# Patient Record
Sex: Male | Born: 1980 | Race: Black or African American | Hispanic: No | Marital: Single | Smoking: Current some day smoker
Health system: Southern US, Community
[De-identification: ages and names within clinical notes are randomized; demographics above are authoritative.]

## PROBLEM LIST (undated history)

## (undated) DIAGNOSIS — R45851 Suicidal ideations: Secondary | ICD-10-CM

## (undated) DIAGNOSIS — T1491XA Suicide attempt, initial encounter: Secondary | ICD-10-CM

## (undated) HISTORY — PX: APPENDECTOMY: SHX54

## (undated) HISTORY — PX: OTHER SURGICAL HISTORY: SHX169

## (undated) HISTORY — PX: HIP FRACTURE SURGERY: SHX118

---

## 2000-06-02 ENCOUNTER — Emergency Department (HOSPITAL_COMMUNITY): Admission: EM | Admit: 2000-06-02 | Discharge: 2000-06-02 | Payer: Self-pay | Admitting: *Deleted

## 2008-05-25 ENCOUNTER — Emergency Department (HOSPITAL_COMMUNITY): Admission: EM | Admit: 2008-05-25 | Discharge: 2008-05-25 | Payer: Self-pay | Admitting: Emergency Medicine

## 2010-05-26 NOTE — Consult Note (Signed)
[place]. [facility]  Patient:    Andrew Snow, Andrew Snow                       MRN: [id number] Adm. Date:  [id number] Attending:  Ephriam Knuckles H                          Consultation Report  REQUESTING PHYSICIAN:  Dyanne Carrel, M.D.  HISTORY OF PRESENT ILLNESS:  Andrew Snow is a 30 year old right-hand dominant [employer] part-time worker who struck the wall of his house on the morning of his evaluation.  He presents now with complaints of right hand pain.  PAST MEDICAL HISTORY:  Insignificant.  He has had a left finger fracture surgery before.  CURRENT MEDICATIONS:  None.  ALLERGIES:  No known drug allergies.  PHYSICAL EXAMINATION:  EXTREMITIES:  On physical examination of the right hand, he has no rotational deformity of any of the fingers.  He has swelling at the metacarpal neck fifth finger.  Sensation perfusion to the fifth finger is intact.  The patient has full composite flexion and extension of all fingers.  Plain x-rays demonstrate about a 20-degree displaced metacarpal neck fracture on the right fifth metacarpal.  IMPRESSION:  Minimally displaced right metacarpal neck fracture.  PLAN:  I placed the patient in an ulnar gutter splint with the metacarpophalangeals flexed.  I am going to see him back on Friday for repeat x-rays in the ulnar gutter splint.  I gave him a note for work and also pain medicine, Vicodin #30.  I will see him back on Friday. DD:  06/02/00 TD:  06/03/00 Job: [id number]

## 2010-06-06 ENCOUNTER — Emergency Department (HOSPITAL_COMMUNITY)
Admission: EM | Admit: 2010-06-06 | Discharge: 2010-06-06 | Disposition: A | Discharge status: Home / Self Care | Payer: Self-pay | Attending: Emergency Medicine | Admitting: Emergency Medicine

## 2010-06-06 DIAGNOSIS — Z96649 Presence of unspecified artificial hip joint: Secondary | ICD-10-CM | POA: Insufficient documentation

## 2010-06-06 DIAGNOSIS — G43909 Migraine, unspecified, not intractable, without status migrainosus: Secondary | ICD-10-CM | POA: Insufficient documentation

## 2011-06-11 ENCOUNTER — Emergency Department (HOSPITAL_COMMUNITY)
Admission: EM | Admit: 2011-06-11 | Discharge: 2011-06-11 | Disposition: A | Discharge status: Home / Self Care | Payer: Self-pay | Attending: Emergency Medicine | Admitting: Emergency Medicine

## 2011-06-11 ENCOUNTER — Encounter (HOSPITAL_COMMUNITY): Payer: Self-pay | Admitting: Emergency Medicine

## 2011-06-11 ENCOUNTER — Emergency Department (HOSPITAL_COMMUNITY): Payer: Self-pay

## 2011-06-11 DIAGNOSIS — X58XXXA Exposure to other specified factors, initial encounter: Secondary | ICD-10-CM | POA: Insufficient documentation

## 2011-06-11 DIAGNOSIS — S8391XA Sprain of unspecified site of right knee, initial encounter: Secondary | ICD-10-CM

## 2011-06-11 DIAGNOSIS — IMO0002 Reserved for concepts with insufficient information to code with codable children: Secondary | ICD-10-CM | POA: Insufficient documentation

## 2011-06-11 DIAGNOSIS — Y9367 Activity, basketball: Secondary | ICD-10-CM | POA: Insufficient documentation

## 2011-06-11 DIAGNOSIS — F17200 Nicotine dependence, unspecified, uncomplicated: Secondary | ICD-10-CM | POA: Insufficient documentation

## 2011-06-11 MED ORDER — NAPROXEN 500 MG PO TABS: 500.0000 mg | ORAL_TABLET | Freq: Two times a day (BID) | ORAL | Status: AC

## 2011-06-11 NOTE — ED Provider Notes (Signed)
History     CSN: [id number]  Arrival date & time 06/11/11  4098   First MD Initiated Contact with Patient 06/11/11 [phone]      Chief Complaint  Patient presents with  . Knee Pain    (Consider location/radiation/quality/duration/timing/severity/associated sxs/prior treatment) Patient is a 31 y.o. male presenting with knee pain. The history is provided by the patient.  Knee Pain This is a new problem. The current episode started in the past 7 days. The problem has been gradually worsening. Associated symptoms include arthralgias and joint swelling. Pertinent negatives include no chills, fever, numbness or weakness.  Pt states he was playing basketball. States stepped the wrong way and felt a "pop" and pain in the right knee. States was able to finish playing, however since then pain in the knee, liming. Pain worsened with movement and bearing weight. No other injuries. No prior injuries to that knee. Iced at home, taken ibuprofen with no relief. Injury occurred 5 days ago.  History reviewed. No pertinent past medical history.  History reviewed. No pertinent past surgical history.  No family history on file.  History  Substance Use Topics  . Smoking status: Current Everyday Smoker    Types: Cigarettes  . Smokeless tobacco: Not on file  . Alcohol Use: Yes     socially      Review of Systems  Constitutional: Negative for fever and chills.  Respiratory: Negative.   Cardiovascular: Negative.   Musculoskeletal: Positive for joint swelling and arthralgias.  Skin: Negative.   Neurological: Negative for weakness and numbness.    Allergies  Review of patient's allergies indicates no known allergies.  Home Medications   Current Outpatient Rx  Name Route Sig Dispense Refill  . IBUPROFEN 200 MG PO TABS Oral Take 200-800 mg by mouth every 6 (six) hours as needed. For pain      BP 123/73  Pulse 57  Temp(Src) 98.1 F (36.7 C) (Oral)  Resp 18  SpO2 98%  Physical Exam    Nursing note and vitals reviewed. Constitutional: He is oriented to person, place, and time. He appears well-developed and well-nourished.  Eyes: Conjunctivae are normal.  Neck: Neck supple.  Cardiovascular: Normal rate, regular rhythm and normal heart sounds.   Pulmonary/Chest: Effort normal and breath sounds normal. No respiratory distress. He has no wheezes. He has no rales.  Musculoskeletal:       Right knee swelling noted, mild. Tender to palpation over anterior and lateral right knee joint. Full Rom of the knee joint. Pain and some laxity with anterior drawer test. No laxity with posterior drawer test or with medial or lateral stress.   Neurological: He is alert and oriented to person, place, and time.  Skin: Skin is warm and dry.  Psychiatric: He has a normal mood and affect.    ED Course  Procedures (including critical care time)  No results found for this or any previous visit. Dg Knee Complete 4 Views Right  06/11/2011  *RADIOLOGY REPORT*  Clinical Data: Trauma on 05/30.  Limited range of motion with pain and tightness.  RIGHT KNEE - COMPLETE 4+ VIEW  Comparison: None.  Findings: No acute fracture or dislocation.  Clothing artifact over the distal femur. No joint effusion.  Joint spaces are maintained.  IMPRESSION: No acute osseous abnormality.  Original Report Authenticated By: Consuello Bossier, M.D.    Pt with possible ACL injury, some laxity with anterior drawer test, knee swelling. Pt is ambulatory, however painful.    1. Knee  sprain, right, initial encounter       MDM          Lottie Mussel, PA 06/11/11 1530

## 2011-06-11 NOTE — ED Notes (Signed)
Pt presenting to ed with c/o right knee pain x 4 days s/p injuring it playing basketball pt states he has noticed swelling and worsening pain. Pt able to ambulate in triage

## 2011-06-11 NOTE — Discharge Instructions (Signed)
Your x-ray is normal today. I do suspect you may have injured your ACL ligament. Keep your knee elevated at home. Ice it. Naprosyn for pain and inflammation. Avoid running, jumping, excessive walking. Follow up with orthopedics if not improving.    Knee Sprain You have a knee sprain. Sprains are painful injuries to the joints. A sprain is a partial or complete tearing of ligaments. Ligaments are tough, fibrous tissues that hold bones together at the joints. A strain (sprain) has occurred when a ligament is stretched or damaged. This injury may take several weeks to heal. This is often the same length of time as a bone fracture (break in bone) takes to heal. Even though a fracture (bone break) may not have occurred, the recovery times may be similar. HOME CARE INSTRUCTIONS   Rest the injured area for as long as directed by your caregiver. Then slowly start using the joint as directed by your caregiver and as the pain allows. Use crutches as directed. If the knee was splinted or casted, continue use and care as directed. If an ace bandage has been applied today, it should be removed and reapplied every 3 to 4 hours. It should not be applied tightly, but firmly enough to keep swelling down. Watch toes and feet for swelling, bluish discoloration, coldness, numbness or excessive pain. If any of these symptoms occur, remove the ace bandage and reapply more loosely.If these symptoms persist, seek medical attention.   For the first 24 hours, lie down. Keep the injured extremity elevated on two pillows.   Apply ice to the injured area for 15 to 20 minutes every couple hours. Repeat this 3 to 4 times per day for the first 48 hours. Put the ice in a plastic bag and place a towel between the bag of ice and your skin.   Wear any splinting, casting, or elastic bandage applications as instructed.   Only take over-the-counter or prescription medicines for pain, discomfort, or fever as directed by your caregiver. Do  not use aspirin immediately after the injury unless instructed by your caregiver. Aspirin can cause increased bleeding and bruising of the tissues.   If you were given crutches, continue to use them as instructed. Do not resume weight bearing on the affected extremity until instructed.  Persistent pain and inability to use the injured area as directed for more than 2 to 3 days are warning signs. If this happens you should see a caregiver for a follow-up visit as soon as possible. Initially, a hairline fracture (this is the same as a broken bone) may not be evident on x-rays. Persistent pain and swelling indicate that further evaluation, non-weight bearing (use of crutches as instructed), and/or further x-rays are indicated. X-rays may sometimes not show a small fracture until a week or ten days later. Make a follow-up appointment with your own caregiver or one to whom we have referred you. A radiologist (specialist in reading x-rays) may re-read your X-rays. Make sure you know how you are to get your x-ray results. Do not assume everything is normal if you do not hear from [place]. SEEK MEDICAL CARE IF:   Bruising, swelling, or pain increases.   You have cold or numb toes   You have continuing difficulty or pain with walking.  SEEK IMMEDIATE MEDICAL CARE IF:   Your toes are cold, numb or blue.   The pain is not responding to medications and continues to stay the same or get worse.  MAKE SURE YOU:  Understand these instructions.   Will watch your condition.   Will get help right away if you are not doing well or get worse.  Document Released: 12/25/2004 Document Revised: 12/14/2010 Document Reviewed: 12/09/2006 [facility] Patient Information 2012 [place], [place].

## 2011-06-14 NOTE — ED Provider Notes (Signed)
Medical screening examination/treatment/procedure(s) were performed by non-physician practitioner and as supervising physician I was immediately available for consultation/collaboration.   Suzi Roots, MD 06/14/11 [phone]

## 2012-02-06 ENCOUNTER — Emergency Department (HOSPITAL_COMMUNITY)
Admission: EM | Admit: 2012-02-06 | Discharge: 2012-02-06 | Disposition: A | Discharge status: Home / Self Care | Payer: Self-pay | Attending: Emergency Medicine | Admitting: Emergency Medicine

## 2012-02-06 ENCOUNTER — Encounter (HOSPITAL_COMMUNITY): Payer: Self-pay | Admitting: Emergency Medicine

## 2012-02-06 DIAGNOSIS — M25559 Pain in unspecified hip: Secondary | ICD-10-CM | POA: Insufficient documentation

## 2012-02-06 DIAGNOSIS — T7840XA Allergy, unspecified, initial encounter: Secondary | ICD-10-CM

## 2012-02-06 DIAGNOSIS — L299 Pruritus, unspecified: Secondary | ICD-10-CM | POA: Insufficient documentation

## 2012-02-06 DIAGNOSIS — F17200 Nicotine dependence, unspecified, uncomplicated: Secondary | ICD-10-CM | POA: Insufficient documentation

## 2012-02-06 DIAGNOSIS — R21 Rash and other nonspecific skin eruption: Secondary | ICD-10-CM | POA: Insufficient documentation

## 2012-02-06 DIAGNOSIS — T4995XA Adverse effect of unspecified topical agent, initial encounter: Secondary | ICD-10-CM | POA: Insufficient documentation

## 2012-02-06 DIAGNOSIS — Z79899 Other long term (current) drug therapy: Secondary | ICD-10-CM | POA: Insufficient documentation

## 2012-02-06 MED ORDER — PREDNISONE 20 MG PO TABS
60.0000 mg | ORAL_TABLET | Freq: Once | ORAL | Status: AC
  Administered 2012-02-06: 60 mg via ORAL
  Filled 2012-02-06: qty 3

## 2012-02-06 MED ORDER — HYDROCODONE-ACETAMINOPHEN 5-325 MG PO TABS: 2.0000 | ORAL_TABLET | ORAL | Status: DC | PRN

## 2012-02-06 MED ORDER — PREDNISONE 10 MG PO TABS: ORAL_TABLET | ORAL | Status: DC

## 2012-02-06 NOTE — ED Notes (Addendum)
Pt reports that l/eye was swollen shut this am. L/eye lid swollen , but open at present. Multiple red raised bumps on l/side of face and all extremities noted.Unresponsive to hydrocortisone cream this am. Moderately responsive to Benadryl 50 mg po. Denies difficulty swallowing or shortness of breath Reports recurrent r/hip pain-hx of trauma

## 2012-02-06 NOTE — ED Provider Notes (Signed)
History     CSN: [id number]  Arrival date & time 02/06/12  1227   First MD Initiated Contact with Patient 02/06/12 1256      Chief Complaint  Patient presents with  . Facial Swelling    Pt reports possible inscect bites last night. Red raised bumps on face and extremeties.  . Allergic Reaction  . 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 male presenting with allergic reaction and hip pain. The history is provided by the patient. No language interpreter was used.  Allergic Reaction The primary symptoms are  rash. The primary symptoms do not include wheezing, cough, nausea or vomiting. The current episode started 6 to 12 hours ago. The problem has not changed since onset. The rash is associated with itching.  Significant symptoms also include itching.  Hip Pain This is a recurrent problem. The current episode started in the past 7 days. The problem occurs constantly. The problem has been unchanged. Associated symptoms include a rash. Pertinent negatives include no coughing, joint swelling, nausea, numbness, vomiting or weakness. The symptoms are aggravated by bending.    History reviewed. No pertinent past medical history.  Past Surgical History  Procedure Date  . Appendectomy   . Hip fracture surgery     r/hip    History reviewed. No pertinent family history.  History  Substance Use Topics  . Smoking status: Current Every Day Smoker    Types: Cigarettes  . Smokeless tobacco: Not on file  .  Alcohol Use: Yes     Comment: socially      Review of Systems  Constitutional: Negative.   Respiratory: Negative for cough and wheezing.   Gastrointestinal: Negative for nausea and vomiting.  Musculoskeletal: Negative for joint swelling.  Skin: Positive for itching and rash.  Neurological: Negative for weakness and numbness.    Allergies  Penicillins  Home Medications   Current Outpatient Rx  Name  Route  Sig  Dispense  Refill  . DIPHENHYDRAMINE HCL 25 MG PO TABS   Oral   Take 50 mg by mouth every 6 (six) hours as needed.         Marland Kitchen HYDROCORTISONE 1 % EX CREA   Topical   Apply 1 application topically 2 (two) times daily.         . IBUPROFEN 200 MG PO TABS   Oral   Take 200-800 mg by mouth every 6 (six) hours as needed. For pain         . NAPROXEN 500 MG PO TABS   Oral   Take 1 tablet (500 mg total) by mouth 2 (two) times daily.   30 tablet   0     BP 116/70  Pulse 63  Temp 98.1 F (36.7 C) (Oral)  Resp 18  Physical Exam  Nursing note and vitals reviewed. Constitutional: He is oriented to person, place, and time. He appears well-developed and well-nourished.  HENT:  Right Ear: External ear normal.  Left Ear: External ear normal.  No oral swelling notec  Eyes: Conjunctivae normal and EOM are normal. Pupils are equal, round, and reactive to light.  Neck: Normal range of motion. Neck supple.  Cardiovascular: Normal rate and regular rhythm.   Pulmonary/Chest: Effort normal and breath sounds normal.  Musculoskeletal: Normal range of motion.       Pt tender in the right groin with palpation:pt has full rom  Neurological: He is alert and oriented to person, place, and time.  Skin:       Redness and swelling noted to the left AVW:UJWJ swelling noted to hand:pt has red raised areas to upper body    ED Course  Procedures (including critical care time)  Labs Reviewed - No data to display No results found.   1. Allergic reaction   2. Hip pain        MDM  Will treat for allergic reaction:pt is not having any oral involvement:will do steroid for a couple of days       Teressa Lower, NP 02/06/12 1313

## 2012-02-07 NOTE — ED Provider Notes (Signed)
Medical screening examination/treatment/procedure(s) were performed by non-physician practitioner and as supervising physician I was immediately available for consultation/collaboration.  Casimer Russett T Hania Cerone, MD 02/07/12 0904 

## 2013-05-06 ENCOUNTER — Encounter (HOSPITAL_BASED_OUTPATIENT_CLINIC_OR_DEPARTMENT_OTHER): Payer: Self-pay | Admitting: Emergency Medicine

## 2013-05-06 ENCOUNTER — Emergency Department (HOSPITAL_BASED_OUTPATIENT_CLINIC_OR_DEPARTMENT_OTHER)
Admission: EM | Admit: 2013-05-06 | Discharge: 2013-05-06 | Disposition: A | Discharge status: Home / Self Care | Payer: Self-pay | Attending: Emergency Medicine | Admitting: Emergency Medicine

## 2013-05-06 DIAGNOSIS — S61219A Laceration without foreign body of unspecified finger without damage to nail, initial encounter: Secondary | ICD-10-CM

## 2013-05-06 DIAGNOSIS — Z880 Allergy status to penicillin: Secondary | ICD-10-CM | POA: Insufficient documentation

## 2013-05-06 DIAGNOSIS — F17200 Nicotine dependence, unspecified, uncomplicated: Secondary | ICD-10-CM | POA: Insufficient documentation

## 2013-05-06 DIAGNOSIS — Z23 Encounter for immunization: Secondary | ICD-10-CM | POA: Insufficient documentation

## 2013-05-06 DIAGNOSIS — IMO0002 Reserved for concepts with insufficient information to code with codable children: Secondary | ICD-10-CM | POA: Insufficient documentation

## 2013-05-06 DIAGNOSIS — S61209A Unspecified open wound of unspecified finger without damage to nail, initial encounter: Secondary | ICD-10-CM | POA: Insufficient documentation

## 2013-05-06 DIAGNOSIS — Y9389 Activity, other specified: Secondary | ICD-10-CM | POA: Insufficient documentation

## 2013-05-06 DIAGNOSIS — Y929 Unspecified place or not applicable: Secondary | ICD-10-CM | POA: Insufficient documentation

## 2013-05-06 DIAGNOSIS — W260XXA Contact with knife, initial encounter: Secondary | ICD-10-CM | POA: Insufficient documentation

## 2013-05-06 DIAGNOSIS — W261XXA Contact with sword or dagger, initial encounter: Secondary | ICD-10-CM

## 2013-05-06 MED ORDER — TETANUS-DIPHTH-ACELL PERTUSSIS 5-2.5-18.5 LF-MCG/0.5 IM SUSP
0.5000 mL | Freq: Once | INTRAMUSCULAR | Status: AC
  Administered 2013-05-06: 0.5 mL via INTRAMUSCULAR
  Filled 2013-05-06: qty 0.5

## 2013-05-06 NOTE — ED Provider Notes (Signed)
CSN: [id number]     Arrival date & time 05/06/13  [id number] History   First MD Initiated Contact with Patient 05/06/13 1032     Chief Complaint  Patient presents with  . Extremity Laceration     (Consider location/radiation/quality/duration/timing/severity/associated sxs/prior Treatment) HPI  Patient presents with left third digit laceration. He states that he cut with a knife yesterday at approximately 2 PM. He has noted intermittent bleeding since that time. He is unsure when his last tetanus was. He denies any other injury.  History reviewed. No pertinent past medical history. Past Surgical History  Procedure Laterality Date  . Appendectomy    . Hip fracture surgery      r/hip   No family history on file. History  Substance Use Topics  . Smoking status: Current Every Day Smoker -- 0.50 packs/day    Types: Cigarettes  . Smokeless tobacco: Not on file  . Alcohol Use: Yes     Comment: socially    Review of Systems  Skin: Positive for wound.  Neurological: Negative for numbness.  All other systems reviewed and are negative.     Allergies  Penicillins  Home Medications   Prior to Admission medications   Medication Sig Start Date End Date Taking? Authorizing Provider  diphenhydrAMINE (BENADRYL) 25 MG tablet Take 50 mg by mouth every 6 (six) hours as needed.    Historical Provider, MD  HYDROcodone-acetaminophen (NORCO/VICODIN) 5-325 MG per tablet Take 2 tablets by mouth every 4 (four) hours as needed for pain. 02/06/12   Teressa LowerVrinda Pickering, NP  hydrocortisone cream 1 % Apply 1 application topically 2 (two) times daily.    Historical Provider, MD  ibuprofen (ADVIL,MOTRIN) 200 MG tablet Take 200-800 mg by mouth every 6 (six) hours as needed. For pain    Historical Provider, MD  predniSONE (DELTASONE) 10 MG tablet Six day step down 02/06/12   Teressa LowerVrinda Pickering, NP   BP 131/87  Pulse 81  Temp(Src) 98.1 F (36.7 C) (Oral)  Resp 16  Ht 6' (1.829 m)  Wt 260 lb (117.935 kg)  BMI  35.25 kg/m2  SpO2 100% Physical Exam  Nursing note and vitals reviewed. Constitutional: He is oriented to person, place, and time. He appears well-developed and well-nourished. No distress.  HENT:  Head: Normocephalic and atraumatic.  Cardiovascular: Normal rate and regular rhythm.   No murmur heard. Pulmonary/Chest: Effort normal. No respiratory distress.  Musculoskeletal: He exhibits no edema.  Lymphadenopathy:    He has no cervical adenopathy.  Neurological: He is alert and oriented to person, place, and time.  Skin: Skin is warm and dry.  One has to minor laceration over the left third digit and over the lateral aspect of the pad of the finger and just adjacent to the nailbed. No active bleeding noted, no evidence of erythema, good sensation distal  Psychiatric: He has a normal mood and affect.    ED Course  Procedures (including critical care time) Labs Review Labs Reviewed - No data to display  Imaging Review No results found.   EKG Interpretation None      MDM   Final diagnoses:  Laceration of finger    Patient presents with laceration to the left third digit. Happened approximately 18 hours ago. No signs of infection noted. No active bleeding. Discussed with patient risk for infection with primary closure. Will allow to close by secondary intention. Patient's tetanus was updated. Wound was cleaned and dressed.  After history, exam, and medical workup I feel the patient  has been appropriately medically screened and is safe for discharge home. Pertinent diagnoses were discussed with the patient. Patient was given return precautions.     Shon Batonourtney F Amery Minasyan, MD 05/06/13 1116

## 2013-05-06 NOTE — Discharge Instructions (Signed)
Laceration Care, Adult A laceration is a cut or lesion that goes through all layers of the skin and into the tissue just beneath the skin. TREATMENT  Some lacerations may not require closure. Some lacerations may not be able to be closed due to an increased risk of infection. It is important to see your caregiver as soon as possible after an injury to minimize the risk of infection and maximize the opportunity for successful closure.  HOME CARE INSTRUCTIONS  Keep wound clean dry and covered. You may need a tetanus shot if:  You cannot remember when you had your last tetanus shot.  You have never had a tetanus shot. If you get a tetanus shot, your arm may swell, get red, and feel warm to the touch. This is common and not a problem. If you need a tetanus shot and you choose not to have one, there is a rare chance of getting tetanus. Sickness from tetanus can be serious. SEEK MEDICAL CARE IF:   You have redness, swelling, or increasing pain in the wound.  You see a red line that goes away from the wound.  You have yellowish-white fluid (pus) coming from the wound.  You have a fever.  You notice a bad smell coming from the wound or dressing.  Your wound breaks open before or after sutures have been removed.  You notice something coming out of the wound such as wood or glass.  Your wound is on your hand or foot and you cannot move a finger or toe. SEEK IMMEDIATE MEDICAL CARE IF:   Your pain is not controlled with prescribed medicine.  You have severe swelling around the wound causing pain and numbness or a change in color in your arm, hand, leg, or foot.  Your wound splits open and starts bleeding.  You have worsening numbness, weakness, or loss of function of any joint around or beyond the wound.  You develop painful lumps near the wound or on the skin anywhere on your body. MAKE SURE YOU:   Understand these instructions.  Will watch your condition.  Will get help right  away if you are not doing well or get worse. Document Released: 12/25/2004 Document Revised: 03/19/2011 Document Reviewed: 06/20/2010 [facility] Patient Information 2014 [place], [place].

## 2013-05-06 NOTE — ED Notes (Signed)
Laceration to 3rd digit on right hand sustained by a knife yesterday.

## 2013-07-20 ENCOUNTER — Emergency Department (HOSPITAL_BASED_OUTPATIENT_CLINIC_OR_DEPARTMENT_OTHER)
Admission: EM | Admit: 2013-07-20 | Discharge: 2013-07-20 | Disposition: A | Discharge status: Home / Self Care | Payer: Self-pay | Attending: Emergency Medicine | Admitting: Emergency Medicine

## 2013-07-20 ENCOUNTER — Emergency Department (HOSPITAL_BASED_OUTPATIENT_CLINIC_OR_DEPARTMENT_OTHER): Payer: Self-pay

## 2013-07-20 ENCOUNTER — Encounter (HOSPITAL_BASED_OUTPATIENT_CLINIC_OR_DEPARTMENT_OTHER): Payer: Self-pay | Admitting: Emergency Medicine

## 2013-07-20 DIAGNOSIS — Y939 Activity, unspecified: Secondary | ICD-10-CM | POA: Insufficient documentation

## 2013-07-20 DIAGNOSIS — W2209XA Striking against other stationary object, initial encounter: Secondary | ICD-10-CM | POA: Insufficient documentation

## 2013-07-20 DIAGNOSIS — Y929 Unspecified place or not applicable: Secondary | ICD-10-CM | POA: Insufficient documentation

## 2013-07-20 DIAGNOSIS — S62309A Unspecified fracture of unspecified metacarpal bone, initial encounter for closed fracture: Secondary | ICD-10-CM | POA: Insufficient documentation

## 2013-07-20 DIAGNOSIS — F17200 Nicotine dependence, unspecified, uncomplicated: Secondary | ICD-10-CM | POA: Insufficient documentation

## 2013-07-20 DIAGNOSIS — Z880 Allergy status to penicillin: Secondary | ICD-10-CM | POA: Insufficient documentation

## 2013-07-20 DIAGNOSIS — S62339A Displaced fracture of neck of unspecified metacarpal bone, initial encounter for closed fracture: Secondary | ICD-10-CM

## 2013-07-20 DIAGNOSIS — K644 Residual hemorrhoidal skin tags: Secondary | ICD-10-CM

## 2013-07-20 MED ORDER — HYDROCODONE-ACETAMINOPHEN 5-325 MG PO TABS: 1.0000 | ORAL_TABLET | ORAL | Status: DC | PRN

## 2013-07-20 MED ORDER — HYDROCORTISONE 2.5 % RE CREA: TOPICAL_CREAM | RECTAL | Status: DC

## 2013-07-20 MED ORDER — HYDROCODONE-ACETAMINOPHEN 5-325 MG PO TABS
1.0000 | ORAL_TABLET | Freq: Once | ORAL | Status: AC
  Administered 2013-07-20: 1 via ORAL
  Filled 2013-07-20: qty 1

## 2013-07-20 NOTE — ED Notes (Signed)
C/o left hand injury last pm he states he hit had against wall.  Swelling noted to left hand with abraisions to knuckles noted. NL sensation and pulses.

## 2013-07-20 NOTE — ED Provider Notes (Signed)
CSN: [id number]     Arrival date & time 07/20/13  1110 History   First MD Initiated Contact with Patient 07/20/13 1118     Chief Complaint  Patient presents with  . Hand Problem     (Consider location/radiation/quality/duration/timing/severity/associated sxs/prior Treatment) Patient is a 33 y.o. male presenting with hand injury and hematochezia. The history is provided by the patient. No language interpreter was used.  Hand Injury Location:  Hand Injury: yes   Hand location:  L hand Pain details:    Quality:  Aching   Radiates to:  Does not radiate   Severity:  Moderate Chronicity:  New Dislocation: no   Foreign body present:  No foreign bodies Tetanus status:  Up to date Associated symptoms: no fever   Associated symptoms comment:  He hit a wall with his left hand last night and presents for evaluation of pain and swelling.  Rectal Bleeding Quality:  Bright red Associated symptoms: no abdominal pain and no fever   Associated symptoms comment:  He complains of pain and swelling around the rectum with infrequent bleeding. He reports a history of hemorrhoids.   History reviewed. No pertinent past medical history. Past Surgical History  Procedure Laterality Date  . Appendectomy    . Hip fracture surgery      r/hip   No family history on file. History  Substance Use Topics  . Smoking status: Current Every Day Smoker -- 0.50 packs/day    Types: Cigarettes  . Smokeless tobacco: Not on file  . Alcohol Use: Yes     Comment: socially    Review of Systems  Constitutional: Negative for fever.  Gastrointestinal: Positive for hematochezia and rectal pain. Negative for abdominal pain.  Musculoskeletal:       See HPI.      Allergies  Penicillins  Home Medications   Prior to Admission medications   Medication Sig Start Date End Date Taking? Authorizing Provider  diphenhydrAMINE (BENADRYL) 25 MG tablet Take 50 mg by mouth every 6 (six) hours as needed.    Historical  Provider, MD  HYDROcodone-acetaminophen (NORCO/VICODIN) 5-325 MG per tablet Take 2 tablets by mouth every 4 (four) hours as needed for pain. 02/06/12   Teressa LowerVrinda Pickering, NP  hydrocortisone cream 1 % Apply 1 application topically 2 (two) times daily.    Historical Provider, MD  ibuprofen (ADVIL,MOTRIN) 200 MG tablet Take 200-800 mg by mouth every 6 (six) hours as needed. For pain    Historical Provider, MD  predniSONE (DELTASONE) 10 MG tablet Six day step down 02/06/12   Teressa LowerVrinda Pickering, NP   BP 128/83  Pulse 88  Temp(Src) 98.3 F (36.8 C) (Oral)  Resp 18  SpO2 96% Physical Exam  Constitutional: He is oriented to person, place, and time. He appears well-developed and well-nourished. No distress.  Eyes: Conjunctivae are normal.  Genitourinary:  Multiple swollen nodular areas on anus. No active bleeding. Minimally tender. No discoloration.  Musculoskeletal:  Left hand marked dorsal swelling. No bony deformity or discoloration. There are abrasions to PIP dorsum of 3rd and 4th fingers without swelling or loss of range of motion.   Neurological: He is alert and oriented to person, place, and time.  No sensory deficits of left hand.  Skin: Skin is warm and dry.  Psychiatric: He has a normal mood and affect.    ED Course  Procedures (including critical care time) Labs Review Labs Reviewed - No data to display  Imaging Review No results found.   EKG Interpretation  None      MDM   Final diagnoses:  None  1. Boxer's fracture, left 2. External hemorrhoids  Ulnar gutter splint applied. He is referred to hand for follow up. Anusol given for hemorrhoidal swelling.     Arnoldo Hooker, PA-C 07/20/13 1258

## 2013-07-20 NOTE — Discharge Instructions (Signed)
Boxer's Fracture °You have a break (fracture) of the fifth metacarpal bone. This is commonly called a boxer's fracture. This is the bone in the hand where the little finger attaches. The fracture is in the end of that bone, closest to the little finger. It is usually caused when you hit an object with a clenched fist. Often, the knuckle is pushed down by the impact. Sometimes, the fracture rotates out of position. A boxer's fracture will usually heal within 6 weeks, if it is treated properly and protected from re-injury. Surgery is sometimes needed. °A cast, splint, or bulky hand dressing may be used to protect and immobilize a boxer's fracture. Do not remove this device or dressing until your caregiver approves. Keep your hand elevated, and apply ice packs for 15-20 minutes every 2 hours, for the first 2 days. Elevation and ice help reduce swelling and relieve pain. See your caregiver, or an orthopedic specialist, for follow-up care within the next 10 days. This is to make sure your fracture is healing properly. °Document Released: 12/25/2004 Document Revised: 03/19/2011 Document Reviewed: 06/14/2006 °ExitCare® Patient Information ©2015 [employer]. This information is not intended to replace advice given to you by your health care provider. Make sure you discuss any questions you have with your health care provider. ° °Cast or Splint Care °Casts and splints support injured limbs and keep bones from moving while they heal. It is important to care for your cast or splint at home.   °HOME CARE INSTRUCTIONS °· Keep the cast or splint uncovered during the drying period. It can take 24 to 48 hours to dry if it is made of plaster. A fiberglass cast will dry in less than 1 hour. °· Do not rest the cast on anything harder than a pillow for the first 24 hours. °· Do not put weight on your injured limb or apply pressure to the cast until your health care provider gives you permission. °· Keep the cast or splint dry. Wet  casts or splints can lose their shape and may not support the limb as well. A wet cast that has lost its shape can also create harmful pressure on your skin when it dries. Also, wet skin can become infected. °¨ Cover the cast or splint with a plastic bag when bathing or when out in the rain or snow. If the cast is on the trunk of the body, take sponge baths until the cast is removed. °¨ If your cast does become wet, dry it with a towel or a blow dryer on the cool setting only. °· Keep your cast or splint clean. Soiled casts may be wiped with a moistened cloth. °· Do not place any hard or soft foreign objects under your cast or splint, such as cotton, toilet paper, lotion, or powder. °· Do not try to scratch the skin under the cast with any object. The object could get stuck inside the cast. Also, scratching could lead to an infection. If itching is a problem, use a blow dryer on a cool setting to relieve discomfort. °· Do not trim or cut your cast or remove padding from inside of it. °· Exercise all joints next to the injury that are not immobilized by the cast or splint. For example, if you have a long leg cast, exercise the hip joint and toes. If you have an arm cast or splint, exercise the shoulder, elbow, thumb, and fingers. °· Elevate your injured arm or leg on 1 or 2 pillows for the   first 1 to 3 days to decrease swelling and pain.It is best if you can comfortably elevate your cast so it is higher than your heart. SEEK MEDICAL CARE IF:   Your cast or splint cracks.  Your cast or splint is too tight or too loose.  You have unbearable itching inside the cast.  Your cast becomes wet or develops a soft spot or area.  You have a bad smell coming from inside your cast.  You get an object stuck under your cast.  Your skin around the cast becomes red or raw.  You have new pain or worsening pain after the cast has been applied. SEEK IMMEDIATE MEDICAL CARE IF:   You have fluid leaking through the  cast.  You are unable to move your fingers or toes.  You have discolored (blue or white), cool, painful, or very swollen fingers or toes beyond the cast.  You have tingling or numbness around the injured area.  You have severe pain or pressure under the cast.  You have any difficulty with your breathing or have shortness of breath.  You have chest pain. Document Released: 12/23/1999 Document Revised: 10/15/2012 Document Reviewed: 07/03/2012 [facility] Patient Information 2015 [place], [place]. This information is not intended to replace advice given to you by your health care provider. Make sure you discuss any questions you have with your health care provider. Cryotherapy Cryotherapy means treatment with cold. Ice or gel packs can be used to reduce both pain and swelling. Ice is the most helpful within the first 24 to 48 hours after an injury or flareup from overusing a muscle or joint. Sprains, strains, spasms, burning pain, shooting pain, and aches can all be eased with ice. Ice can also be used when recovering from surgery. Ice is effective, has very few side effects, and is safe for most people to use. PRECAUTIONS  Ice is not a safe treatment option for people with:  Raynaud's phenomenon. This is a condition affecting small blood vessels in the extremities. Exposure to cold may cause your problems to return.  Cold hypersensitivity. There are many forms of cold hypersensitivity, including:  Cold urticaria. Red, itchy hives appear on the skin when the tissues begin to warm after being iced.  Cold erythema. This is a red, itchy rash caused by exposure to cold.  Cold hemoglobinuria. Red blood cells break down when the tissues begin to warm after being iced. The hemoglobin that carry oxygen are passed into the urine because they cannot combine with blood proteins fast enough.  Numbness or altered sensitivity in the area being iced. If you have any of the following conditions, do not use  ice until you have discussed cryotherapy with your caregiver:  Heart conditions, such as arrhythmia, angina, or chronic heart disease.  High blood pressure.  Healing wounds or open skin in the area being iced.  Current infections.  Rheumatoid arthritis.  Poor circulation.  Diabetes. Ice slows the blood flow in the region it is applied. This is beneficial when trying to stop inflamed tissues from spreading irritating chemicals to surrounding tissues. However, if you expose your skin to cold temperatures for too long or without the proper protection, you can damage your skin or nerves. Watch for signs of skin damage due to cold. HOME CARE INSTRUCTIONS Follow these tips to use ice and cold packs safely.  Place a dry or damp towel between the ice and skin. A damp towel will cool the skin more quickly, so you may need  to shorten the time that the ice is used.  For a more rapid response, add gentle compression to the ice.  Ice for no more than 10 to 20 minutes at a time. The bonier the area you are icing, the less time it will take to get the benefits of ice.  Check your skin after 5 minutes to make sure there are no signs of a poor response to cold or skin damage.  Rest 20 minutes or more in between uses.  Once your skin is numb, you can end your treatment. You can test numbness by very lightly touching your skin. The touch should be so light that you do not see the skin dimple from the pressure of your fingertip. When using ice, most people will feel these normal sensations in this order: cold, burning, aching, and numbness.  Do not use ice on someone who cannot communicate their responses to pain, such as small children or people with dementia. HOW TO MAKE AN ICE PACK Ice packs are the most common way to use ice therapy. Other methods include ice massage, ice baths, and cryo-sprays. Muscle creams that cause a cold, tingly feeling do not offer the same benefits that ice offers and should  not be used as a substitute unless recommended by your caregiver. To make an ice pack, do one of the following:  Place crushed ice or a bag of frozen vegetables in a sealable plastic bag. Squeeze out the excess air. Place this bag inside another plastic bag. Slide the bag into a pillowcase or place a damp towel between your skin and the bag.  Mix 3 parts water with 1 part rubbing alcohol. Freeze the mixture in a sealable plastic bag. When you remove the mixture from the freezer, it will be slushy. Squeeze out the excess air. Place this bag inside another plastic bag. Slide the bag into a pillowcase or place a damp towel between your skin and the bag. SEEK MEDICAL CARE IF:  You develop white spots on your skin. This may give the skin a blotchy (mottled) appearance.  Your skin turns blue or pale.  Your skin becomes waxy or hard.  Your swelling gets worse. MAKE SURE YOU:   Understand these instructions.  Will watch your condition.  Will get help right away if you are not doing well or get worse. Document Released: 08/21/2010 Document Revised: 03/19/2011 Document Reviewed: 08/21/2010 [facility] Patient Information 2015 [place], [place]. This information is not intended to replace advice given to you by your health care provider. Make sure you discuss any questions you have with your health care provider.

## 2013-07-21 NOTE — ED Provider Notes (Signed)
Medical screening examination/treatment/procedure(s) were performed by non-physician practitioner and as supervising physician I was immediately available for consultation/collaboration.   Francheska Villeda T Duaa Stelzner, MD 07/21/13 1012 

## 2013-07-26 ENCOUNTER — Emergency Department (HOSPITAL_BASED_OUTPATIENT_CLINIC_OR_DEPARTMENT_OTHER): Payer: Self-pay

## 2013-07-26 ENCOUNTER — Emergency Department (HOSPITAL_BASED_OUTPATIENT_CLINIC_OR_DEPARTMENT_OTHER)
Admission: EM | Admit: 2013-07-26 | Discharge: 2013-07-26 | Disposition: A | Discharge status: Home / Self Care | Payer: Self-pay | Attending: Emergency Medicine | Admitting: Emergency Medicine

## 2013-07-26 ENCOUNTER — Encounter (HOSPITAL_BASED_OUTPATIENT_CLINIC_OR_DEPARTMENT_OTHER): Payer: Self-pay | Admitting: Emergency Medicine

## 2013-07-26 DIAGNOSIS — Z79899 Other long term (current) drug therapy: Secondary | ICD-10-CM | POA: Insufficient documentation

## 2013-07-26 DIAGNOSIS — Y9389 Activity, other specified: Secondary | ICD-10-CM | POA: Insufficient documentation

## 2013-07-26 DIAGNOSIS — IMO0002 Reserved for concepts with insufficient information to code with codable children: Secondary | ICD-10-CM | POA: Insufficient documentation

## 2013-07-26 DIAGNOSIS — S62309D Unspecified fracture of unspecified metacarpal bone, subsequent encounter for fracture with routine healing: Secondary | ICD-10-CM

## 2013-07-26 DIAGNOSIS — F17200 Nicotine dependence, unspecified, uncomplicated: Secondary | ICD-10-CM | POA: Insufficient documentation

## 2013-07-26 DIAGNOSIS — S62339A Displaced fracture of neck of unspecified metacarpal bone, initial encounter for closed fracture: Secondary | ICD-10-CM | POA: Insufficient documentation

## 2013-07-26 DIAGNOSIS — Z8781 Personal history of (healed) traumatic fracture: Secondary | ICD-10-CM | POA: Insufficient documentation

## 2013-07-26 DIAGNOSIS — Y9289 Other specified places as the place of occurrence of the external cause: Secondary | ICD-10-CM | POA: Insufficient documentation

## 2013-07-26 DIAGNOSIS — Z880 Allergy status to penicillin: Secondary | ICD-10-CM | POA: Insufficient documentation

## 2013-07-26 MED ORDER — HYDROCODONE-ACETAMINOPHEN 5-325 MG PO TABS
1.0000 | ORAL_TABLET | Freq: Once | ORAL | Status: AC
  Administered 2013-07-26: 1 via ORAL
  Filled 2013-07-26: qty 1

## 2013-07-26 NOTE — ED Notes (Signed)
Pt states he injured lt arm and has cast on it. Pt reports he hit it at work today and wants it checked out .

## 2013-07-26 NOTE — Discharge Instructions (Signed)
Do not hesitate to return to the Emergency Department for any new, worsening or concerning symptoms.   If you do not have a primary care doctor you can establish one at the   [facility]: [address] [place] [id number] [phone]  After you establish care. Let them know you were seen in the emergency room. They must obtain records for further management.    Cast or Splint Care Casts and splints support injured limbs and keep bones from moving while they heal.  HOME CARE  Keep the cast or splint uncovered during the drying period.  A plaster cast can take 24 to 48 hours to dry.  A fiberglass cast will dry in less than 1 hour.  Do not rest the cast on anything harder than a pillow for 24 hours.  Do not put weight on your injured limb. Do not put pressure on the cast. Wait for your doctor's approval.  Keep the cast or splint dry.  Cover the cast or splint with a plastic bag during baths or wet weather.  If you have a cast over your chest and belly (trunk), take sponge baths until the cast is taken off.  If your cast gets wet, dry it with a towel or blow dryer. Use the cool setting on the blow dryer.  Keep your cast or splint clean. Wash a dirty cast with a damp cloth.  Do not put any objects under your cast or splint.  Do not scratch the skin under the cast with an object. If itching is a problem, use a blow dryer on a cool setting over the itchy area.  Do not trim or cut your cast.  Do not take out the padding from inside your cast.  Exercise your joints near the cast as told by your doctor.  Raise (elevate) your injured limb on 1 or 2 pillows for the first 1 to 3 days. GET HELP IF:  Your cast or splint cracks.  Your cast or splint is too tight or too loose.  You itch badly under the cast.  Your cast gets wet or has a soft spot.  You have a bad smell coming from the cast.  You get an object stuck under the cast.  Your skin around the  cast becomes red or sore.  You have new or more pain after the cast is put on. GET HELP RIGHT AWAY IF:  You have fluid leaking through the cast.  You cannot move your fingers or toes.  Your fingers or toes turn blue or white or are cool, painful, or puffy (swollen).  You have tingling or lose feeling (numbness) around the injured area.  You have bad pain or pressure under the cast.  You have trouble breathing or have shortness of breath.  You have chest pain. Document Released: 04/26/2010 Document Revised: 08/27/2012 Document Reviewed: 07/03/2012 [facility] Patient Information 2015 [place], [place]. This information is not intended to replace advice given to you by your health care provider. Make sure you discuss any questions you have with your health care provider.

## 2013-07-26 NOTE — ED Provider Notes (Signed)
CSN: [id number]     Arrival date & time 07/26/13  1845 History   First MD Initiated Contact with Patient 07/26/13 1920     Chief Complaint  Patient presents with  . Arm Injury     (Consider location/radiation/quality/duration/timing/severity/associated sxs/prior Treatment) HPI   Andrew Snow is a 33 y.o. male presenting for evaluation of left hand. Patient was diagnosed with boxer's fracture approximately a week ago. States that when he was at work he hit the splint and pain has increased since that time he states that there is a tingling in the fifth digit which is new. Has appointment with hand surgeon for next week. Patient has not filled his prescription for pain medication since his last visit. Patient is not driving home today.  History reviewed. No pertinent past medical history. Past Surgical History  Procedure Laterality Date  . Appendectomy    . Hip fracture surgery      r/hip   History reviewed. No pertinent family history. History  Substance Use Topics  . Smoking status: Current Every Day Smoker -- 0.50 packs/day    Types: Cigarettes  . Smokeless tobacco: Not on file  . Alcohol Use: Yes     Comment: socially    Review of Systems  10 systems reviewed and found to be negative, except as noted in the HPI.   Allergies  Penicillins  Home Medications   Prior to Admission medications   Medication Sig Start Date End Date Taking? Authorizing Provider  diphenhydrAMINE (BENADRYL) 25 MG tablet Take 50 mg by mouth every 6 (six) hours as needed.    Historical Provider, MD  HYDROcodone-acetaminophen (NORCO/VICODIN) 5-325 MG per tablet Take 2 tablets by mouth every 4 (four) hours as needed for pain. 02/06/12   Teressa Lower, NP  HYDROcodone-acetaminophen (NORCO/VICODIN) 5-325 MG per tablet Take 1-2 tablets by mouth every 4 (four) hours as needed for moderate pain. 07/20/13   Shari A Upstill, PA-C  hydrocortisone (ANUSOL-HC) 2.5 % rectal cream Apply rectally 2 times daily  07/20/13   Shari A Upstill, PA-C  hydrocortisone cream 1 % Apply 1 application topically 2 (two) times daily.    Historical Provider, MD  ibuprofen (ADVIL,MOTRIN) 200 MG tablet Take 200-800 mg by mouth every 6 (six) hours as needed. For pain    Historical Provider, MD  predniSONE (DELTASONE) 10 MG tablet Six day step down 02/06/12   Teressa Lower, NP   BP 127/78  Temp(Src) 97 F (36.1 C) (Oral)  Resp 20  Ht 6' (1.829 m)  Wt 260 lb (117.935 kg)  BMI 35.25 kg/m2  SpO2 97% Physical Exam  Nursing note and vitals reviewed. Constitutional: He is oriented to person, place, and time. He appears well-developed and well-nourished. No distress.  HENT:  Head: Normocephalic and atraumatic.  Mouth/Throat: Oropharynx is clear and moist.  Eyes: Conjunctivae and EOM are normal. Pupils are equal, round, and reactive to light.  Cardiovascular: Normal rate, regular rhythm and intact distal pulses.   Pulmonary/Chest: Effort normal and breath sounds normal. No stridor. No respiratory distress. He has no wheezes. He has no rales. He exhibits no tenderness.  Abdominal: Soft. Bowel sounds are normal. He exhibits no distension and no mass. There is no tenderness. There is no rebound and no guarding.  Musculoskeletal: Normal range of motion.  Ulnar gutter splint in place with Ace wrap unwrapped. Patient can move all digits, cap refill is less than 2 seconds x5 digits. Distal sensation is grossly intact.  Neurological: He is alert and  oriented to person, place, and time.  Psychiatric: He has a normal mood and affect.    ED Course  Procedures (including critical care time) Labs Review Labs Reviewed - No data to display  Imaging Review Dg Hand Complete Left  07/26/2013   CLINICAL DATA:  History of fifth metacarpal fracture with twisting injury.  EXAM: LEFT HAND - COMPLETE 3+ VIEW  COMPARISON:  07/20/2013.  FINDINGS: There is a fracture of the fifth metacarpal neck, with possible minimally increased  displacement and apex dorsal angulation. Overlying soft tissue swelling has decreased slightly in the interval. No callus formation. Fracture does not extend to the metacarpophalangeal joint.  IMPRESSION: Fifth metacarpal neck fracture, with possible minimally increased displacement apex dorsal angulation.   Electronically Signed   By: Leanna BattlesMelinda  Blietz M.D.   On: 07/26/2013 19:50     EKG Interpretation None      MDM   Final diagnoses:  Boxer's fracture, with routine healing, subsequent encounter    Filed Vitals:   07/26/13 1852  BP: 127/78  Temp: 97 F (36.1 C)  TempSrc: Oral  Resp: 20  Height: 6' (1.829 m)  Weight: 260 lb (117.935 kg)  SpO2: 97%    Medications  HYDROcodone-acetaminophen (NORCO/VICODIN) 5-325 MG per tablet 1 tablet (not administered)    Andrew Snow is a 33 y.o. male presenting for wound check to left fifth digit boxer's fracture recheck. Patient states that he was at work today and he did the splits took off Ace wrap and couldn't put it back together. States that the pain is slightly increased and there is a tingling sensation in the fifth digit. On my exam he is neurovascularly intact.  Repeat x-ray shows a possible slight increase in angulation of the fracture. Ulnar gutter will be reapplied. Patient states he has prescription for pain medication at home. I advised him to follow with hand surgeon for recheck this week.  Evaluation does not show pathology that would require ongoing emergent intervention or inpatient treatment. Pt is hemodynamically stable and mentating appropriately. Discussed findings and plan with patient/guardian, who agrees with care plan. All questions answered. Return precautions discussed and outpatient follow up given.      Wynetta Emeryicole Tawna Alwin, PA-C 07/26/13 2003

## 2013-07-26 NOTE — ED Notes (Signed)
Patient ready for discharge. 

## 2013-07-27 NOTE — ED Provider Notes (Signed)
Medical screening examination/treatment/procedure(s) were performed by non-physician practitioner and as supervising physician I was immediately available for consultation/collaboration.   EKG Interpretation None       Hurman HornJohn M Wilkie Zenon, MD 07/27/13 2206

## 2014-10-01 ENCOUNTER — Emergency Department: Payer: Self-pay

## 2014-10-01 ENCOUNTER — Emergency Department
Admission: EM | Admit: 2014-10-01 | Discharge: 2014-10-01 | Disposition: A | Discharge status: Home / Self Care | Payer: Self-pay | Attending: Emergency Medicine | Admitting: Emergency Medicine

## 2014-10-01 ENCOUNTER — Encounter: Payer: Self-pay | Admitting: Emergency Medicine

## 2014-10-01 DIAGNOSIS — Z720 Tobacco use: Secondary | ICD-10-CM | POA: Insufficient documentation

## 2014-10-01 DIAGNOSIS — Z880 Allergy status to penicillin: Secondary | ICD-10-CM | POA: Insufficient documentation

## 2014-10-01 DIAGNOSIS — H05012 Cellulitis of left orbit: Secondary | ICD-10-CM | POA: Insufficient documentation

## 2014-10-01 DIAGNOSIS — L03213 Periorbital cellulitis: Secondary | ICD-10-CM

## 2014-10-01 DIAGNOSIS — Z79899 Other long term (current) drug therapy: Secondary | ICD-10-CM | POA: Insufficient documentation

## 2014-10-01 LAB — CBC
HCT: 42.4 % (ref 40.0–52.0)
Hemoglobin: 14.4 g/dL (ref 13.0–18.0)
MCH: 30 pg (ref 26.0–34.0)
MCHC: 34 g/dL (ref 32.0–36.0)
MCV: 88.2 fL (ref 80.0–100.0)
PLATELETS: 182 10*3/uL (ref 150–440)
RBC: 4.81 MIL/uL (ref 4.40–5.90)
RDW: 14.4 % (ref 11.5–14.5)
WBC: 9.8 10*3/uL (ref 3.8–10.6)

## 2014-10-01 LAB — BASIC METABOLIC PANEL
ANION GAP: 5 (ref 5–15)
BUN: 9 mg/dL (ref 6–20)
CALCIUM: 8.5 mg/dL — AB (ref 8.9–10.3)
CO2: 28 mmol/L (ref 22–32)
Chloride: 104 mmol/L (ref 101–111)
Creatinine, Ser: 0.66 mg/dL (ref 0.61–1.24)
Glucose, Bld: 112 mg/dL — ABNORMAL HIGH (ref 65–99)
POTASSIUM: 3.8 mmol/L (ref 3.5–5.1)
SODIUM: 137 mmol/L (ref 135–145)

## 2014-10-01 MED ORDER — HYDROCODONE-ACETAMINOPHEN 5-325 MG PO TABS
1.0000 | ORAL_TABLET | Freq: Once | ORAL | Status: AC
  Administered 2014-10-01: 1 via ORAL

## 2014-10-01 MED ORDER — TETRACAINE HCL 0.5 % OP SOLN
OPHTHALMIC | Status: AC
  Filled 2014-10-01: qty 2

## 2014-10-01 MED ORDER — TRAMADOL HCL 50 MG PO TABS: 50.0000 mg | ORAL_TABLET | Freq: Four times a day (QID) | ORAL | Status: DC | PRN

## 2014-10-01 MED ORDER — HYDROCODONE-ACETAMINOPHEN 5-325 MG PO TABS
ORAL_TABLET | ORAL | Status: AC
  Administered 2014-10-01: 1 via ORAL
  Filled 2014-10-01: qty 1

## 2014-10-01 MED ORDER — CLINDAMYCIN HCL 150 MG PO CAPS: 300.0000 mg | ORAL_CAPSULE | Freq: Three times a day (TID) | ORAL | Status: DC

## 2014-10-01 MED ORDER — IOHEXOL 300 MG/ML  SOLN
75.0000 mL | Freq: Once | INTRAMUSCULAR | Status: AC | PRN
  Administered 2014-10-01: 75 mL via INTRAVENOUS
  Filled 2014-10-01: qty 75

## 2014-10-01 MED ORDER — FLUORESCEIN SODIUM 1 MG OP STRP
ORAL_STRIP | OPHTHALMIC | Status: AC
  Filled 2014-10-01: qty 1

## 2014-10-01 MED ORDER — CLINDAMYCIN HCL 150 MG PO CAPS
300.0000 mg | ORAL_CAPSULE | Freq: Once | ORAL | Status: AC
  Administered 2014-10-01: 300 mg via ORAL
  Filled 2014-10-01: qty 2

## 2014-10-01 NOTE — ED Provider Notes (Signed)
CSN: [id number]     Arrival date & time 10/01/14  1858 History   First MD Initiated Contact with Patient 10/01/14 2037     Chief Complaint  Patient presents with  . Facial Swelling  . Eye Pain     (Consider location/radiation/quality/duration/timing/severity/associated sxs/prior Treatment) HPI  34 year old male presents to the emergency department for evaluation of left eyelid swelling. Symptoms began this morning upon awakening. He denies any trauma or injury. Pain is mild and increased with touch as well as mild with eye range of motion. He denies any recent sinus respiratory infection. No recent dental infections. His pain is described as an ache. He has had drainage and blurred vision out of the left eye. No headache fevers. No history of STD infections.  History reviewed. No pertinent past medical history. Past Surgical History  Procedure Laterality Date  . Appendectomy    . Hip fracture surgery      r/hip  . Right hip surgery     History reviewed. No pertinent family history. Social History  Substance Use Topics  . Smoking status: Current Every Day Smoker -- 0.50 packs/day    Types: Cigarettes  . Smokeless tobacco: Never Used  . Alcohol Use: Yes     Comment: socially    Review of Systems  Constitutional: Negative.  Negative for fever, chills, activity change and appetite change.  HENT: Negative for congestion, ear pain, mouth sores, rhinorrhea, sinus pressure, sore throat and trouble swallowing.   Eyes: Positive for pain, discharge and redness (eyelid redness). Negative for photophobia.  Respiratory: Negative for cough, chest tightness and shortness of breath.   Cardiovascular: Negative for chest pain and leg swelling.  Gastrointestinal: Negative for nausea, vomiting, abdominal pain, diarrhea and abdominal distention.  Genitourinary: Negative for dysuria and difficulty urinating.  Musculoskeletal: Negative for back pain, arthralgias and gait problem.  Skin: Negative for  color change and rash.  Neurological: Negative for dizziness and headaches.  Hematological: Negative for adenopathy.  Psychiatric/Behavioral: Negative for behavioral problems and agitation.      Allergies  Penicillins  Home Medications   Prior to Admission medications   Medication Sig Start Date End Date Taking? Authorizing Provider  clindamycin (CLEOCIN) 150 MG capsule Take 2 capsules (300 mg total) by mouth 3 (three) times daily. X 10 days 10/01/14   Evon Slack, PA-C  diphenhydrAMINE (BENADRYL) 25 MG tablet Take 50 mg by mouth every 6 (six) hours as needed.    Historical Provider, MD  HYDROcodone-acetaminophen (NORCO/VICODIN) 5-325 MG per tablet Take 2 tablets by mouth every 4 (four) hours as needed for pain. 02/06/12   Teressa Lower, NP  HYDROcodone-acetaminophen (NORCO/VICODIN) 5-325 MG per tablet Take 1-2 tablets by mouth every 4 (four) hours as needed for moderate pain. 07/20/13   Elpidio Anis, PA-C  hydrocortisone (ANUSOL-HC) 2.5 % rectal cream Apply rectally 2 times daily 07/20/13   Elpidio Anis, PA-C  hydrocortisone cream 1 % Apply 1 application topically 2 (two) times daily.    Historical Provider, MD  ibuprofen (ADVIL,MOTRIN) 200 MG tablet Take 200-800 mg by mouth every 6 (six) hours as needed. For pain    Historical Provider, MD  predniSONE (DELTASONE) 10 MG tablet Six day step down 02/06/12   Teressa Lower, NP  traMADol (ULTRAM) 50 MG tablet Take 1 tablet (50 mg total) by mouth every 6 (six) hours as needed. 10/01/14   Evon Slack, PA-C   BP 124/73 mmHg  Pulse 79  Temp(Src) 98.5 F (36.9 C) (Oral)  Resp  14  Ht 6' (1.829 m)  Wt 240 lb (108.863 kg)  BMI 32.54 kg/m2  SpO2 97% Physical Exam  Constitutional: He is oriented to person, place, and time. He appears well-developed and well-nourished.  HENT:  Head: Normocephalic and atraumatic.  Eyes: EOM are normal. Pupils are equal, round, and reactive to light. Lids are everted and swept, no foreign bodies found.  Left eye exhibits discharge. Left eye exhibits no chemosis, no exudate and no hordeolum. No foreign body present in the left eye. Left conjunctiva is injected. Left conjunctiva has no hemorrhage. No scleral icterus. Left eye exhibits normal extraocular motion. Left pupil is round and reactive. Pupils are equal.  Positive left upper and lower eyelid swelling with erythema. Conjunctiva drainage, clear. Wood's lamp with flourscein stain showed no abrasions or ulcerations. Visual acuity right eye 20/70, left eye 20/100.  Neck: Normal range of motion. Neck supple.  Cardiovascular: Normal rate, regular rhythm, normal heart sounds and intact distal pulses.   Pulmonary/Chest: Effort normal and breath sounds normal. No respiratory distress. He has no wheezes. He has no rales. He exhibits no tenderness.  Abdominal: Soft. Bowel sounds are normal. He exhibits no distension. There is no tenderness.  Musculoskeletal: Normal range of motion. He exhibits no edema or tenderness.  Neurological: He is alert and oriented to person, place, and time.  Skin: Skin is warm and dry.  Psychiatric: He has a normal mood and affect. His behavior is normal. Judgment and thought content normal.    ED Course  Procedures (including critical care time) Labs Review Labs Reviewed  BASIC METABOLIC PANEL - Abnormal; Notable for the following:    Glucose, Bld 112 (*)    Calcium 8.5 (*)    All other components within normal limits  CBC    Imaging Review Ct Orbits W/cm  10/01/2014   CLINICAL DATA:  Left eye redness, swelling and foreign body sensation.  EXAM: CT ORBITS WITH CONTRAST  TECHNIQUE: Multidetector CT imaging of the orbits was performed following the bolus administration of intravenous contrast.  CONTRAST:  75mL OMNIPAQUE IOHEXOL 300 MG/ML  SOLN  COMPARISON:  None.  FINDINGS: Very mild preseptal soft tissue swelling. The orbital contents have normal appearances. No visible foreign body. No fluid collections or masses. No  bony abnormalities.  IMPRESSION: Mild left preseptal cellulitis without abscess. No visible foreign body.   Electronically Signed   By: Beckie Salts M.D.   On: 10/01/2014 22:22   I have personally reviewed and evaluated these images and lab results as part of my medical decision-making.   EKG Interpretation None      MDM   Final diagnoses:  Periorbital cellulitis of left eye    34 year old male with acute left eyelid swelling and conjunctival drainage. Mild pain with extraocular movement. CT of the orbits with contrast showed no abscess but mild left preseptal cellulitis. Patient given follow-up with ophthalmology. Started on clindamycin 300 mg 3 times a day 10 days. Return to the ER for any worsening symptoms or urgent changes in health.    Evon Slack, PA-C 10/01/14 2248  Richardean Canal, MD 10/01/14 [phone]

## 2014-10-01 NOTE — ED Notes (Signed)
Pt with left eye redness, swelling noted to upper and lower lids. Pt with slight amount of swelling noted periorbitally. Pt states "it feel like something is in it."

## 2014-10-01 NOTE — ED Notes (Signed)
Patient with no complaints at this time. Respirations even and unlabored. Skin warm/dry. Discharge instructions reviewed with patient at this time. Patient given opportunity to voice concerns/ask questions. IV removed per policy and band-aid applied to site. Patient discharged at this time and left Emergency Department with steady gait.  

## 2014-10-01 NOTE — Discharge Instructions (Signed)
Periorbital Cellulitis °Periorbital cellulitis is a common infection that can affect the eyelid and the soft tissues that surround the eyeball. The infection may also affect the structures that produce and drain tears. It does not affect the eyeball itself. Natural tissue barriers usually prevent the spread of this infection to the eyeball and other deeper areas of the eye socket.      °CAUSES °· Bacterial infection. °· Long-term (chronic) sinus infections. °· An object (foreign body) stuck behind the eye. °· An injury that goes through the eyelid tissues. °· An injury that causes an infection, such as an insect sting. °· Fracture of the bone around the eye. °· Infections which have spread from the eyelid or other structures around the eye. °· Bite wounds. °· Inflammation or infection of the lining membranes of the brain (meningitis). °· An infection in the blood (septicemia). °· Dental infection (abscess). °· Viral infection (this is rare). °SYMPTOMS °Symptoms usually come on suddenly. °· Pain in the eye. °· Red, hot, and swollen eyelids and possibly cheeks. The swelling is sometimes bad enough that the eyelids cannot open. Some infections make the eyelids look purple. °· Fever and feeling generally ill. °· Pain when touching the area around the eye. °DIAGNOSIS  °Periorbital cellulitis can be diagnosed from an eye exam. In severe cases, your caregiver might suggest: °· Blood tests. °· Imaging tests (such as a CT scan) to examine the sinuses and the area around and behind the eyeball. °TREATMENT °If your caregiver feels that you do not have any signs of serious infection, treatment may include: °· Antibiotics. °· Nasal decongestants to reduce swelling. °· Referral to a dentist if it is suspected that the infection was caused by a prior tooth infection. °· Examination every day to make sure the problem is improving. °HOME CARE INSTRUCTIONS °· Take your antibiotics as directed. Finish them even if you start to feel  better. °· Some pain is normal with this condition. Take pain medicine as directed by your caregiver. Only take pain medicines approved by your caregiver. °· It is important to drink fluids. Drink enough water and fluids to keep your urine clear or pale yellow. °· Do not smoke. °· Rest and get plenty of sleep. °· Mild or moderate fevers generally have no long-term effects and often do not require treatment. °· If your caregiver has given you a follow-up appointment, it is very important to keep that appointment. Your caregiver will need to make sure that the infection is getting better. It is important to check that a more serious infection is not developing. °SEEK IMMEDIATE MEDICAL CARE IF: °· Your eyelids become more painful, red, warm, or swollen. °· You develop double vision or your vision becomes blurred or worsens in any way. °· You have trouble moving your eyes. °· The eye looks like it is popping out (proptosis). °· You develop a severe headache, severe neck pain, or neck stiffness. °· You develop repeated vomiting. °· You have a fever or persistent symptoms for more than 72 hours. °· You have a fever and your symptoms suddenly get worse. °MAKE SURE YOU: °· Understand these instructions. °· Will watch your condition. °· Will get help right away if you are not doing well or get worse. °Document Released: 01/27/2010 Document Revised: 03/19/2011 Document Reviewed: 01/27/2010 °[employer]® Patient Information ©2015 [employer]. This information is not intended to replace advice given to you by your health care provider. Make sure you discuss any questions you have with your health care provider. ° °

## 2014-10-23 ENCOUNTER — Emergency Department: Payer: Self-pay

## 2014-10-23 ENCOUNTER — Encounter: Payer: Self-pay | Admitting: *Deleted

## 2014-10-23 ENCOUNTER — Emergency Department
Admission: EM | Admit: 2014-10-23 | Discharge: 2014-10-23 | Disposition: A | Discharge status: Home / Self Care | Payer: Self-pay | Attending: Emergency Medicine | Admitting: Emergency Medicine

## 2014-10-23 DIAGNOSIS — Z880 Allergy status to penicillin: Secondary | ICD-10-CM | POA: Insufficient documentation

## 2014-10-23 DIAGNOSIS — Z720 Tobacco use: Secondary | ICD-10-CM | POA: Insufficient documentation

## 2014-10-23 DIAGNOSIS — Y9289 Other specified places as the place of occurrence of the external cause: Secondary | ICD-10-CM | POA: Insufficient documentation

## 2014-10-23 DIAGNOSIS — Z792 Long term (current) use of antibiotics: Secondary | ICD-10-CM | POA: Insufficient documentation

## 2014-10-23 DIAGNOSIS — W010XXA Fall on same level from slipping, tripping and stumbling without subsequent striking against object, initial encounter: Secondary | ICD-10-CM | POA: Insufficient documentation

## 2014-10-23 DIAGNOSIS — Y9389 Activity, other specified: Secondary | ICD-10-CM | POA: Insufficient documentation

## 2014-10-23 DIAGNOSIS — Z79899 Other long term (current) drug therapy: Secondary | ICD-10-CM | POA: Insufficient documentation

## 2014-10-23 DIAGNOSIS — Y998 Other external cause status: Secondary | ICD-10-CM | POA: Insufficient documentation

## 2014-10-23 DIAGNOSIS — Z791 Long term (current) use of non-steroidal anti-inflammatories (NSAID): Secondary | ICD-10-CM | POA: Insufficient documentation

## 2014-10-23 DIAGNOSIS — S8392XA Sprain of unspecified site of left knee, initial encounter: Secondary | ICD-10-CM | POA: Insufficient documentation

## 2014-10-23 MED ORDER — NAPROXEN 500 MG PO TABS: 500.0000 mg | ORAL_TABLET | Freq: Two times a day (BID) | ORAL | Status: DC

## 2014-10-23 NOTE — ED Notes (Signed)
Pt states left knee pain after he slipped and fell on the floor Friday morning

## 2014-10-23 NOTE — ED Notes (Signed)
NAD noted at time of D/C. Pt denies questions or concerns. Pt ambulatory to the lobby at this time.  

## 2014-10-23 NOTE — ED Provider Notes (Signed)
[facility] Emergency Department Provider Note  ____________________________________________  Time seen: Approximately 3:36 PM  I have reviewed the triage vital signs and the nursing notes.   HISTORY  Chief Complaint Knee Pain    HPI Andrew Snow is a 34 y.o. male patient complaining of left lateral knee pain secondary to a slip and fall yesterday. Patient states occurred early Friday morning when he went to go to the bathroom. Since the incident the patient state increased pain with ambulation and flexion of the knee. No palliative measures taken for this complaint. Patient rated his pain as a 10 over 10. Patient is internal fixation right hip.   History reviewed. No pertinent past medical history.  There are no active problems to display for this patient.   Past Surgical History  Procedure Laterality Date  . Appendectomy    . Hip fracture surgery      r/hip  . Right hip surgery      Current Outpatient Rx  Name  Route  Sig  Dispense  Refill  . clindamycin (CLEOCIN) 150 MG capsule   Oral   Take 2 capsules (300 mg total) by mouth 3 (three) times daily. X 10 days   60 capsule   0   . diphenhydrAMINE (BENADRYL) 25 MG tablet   Oral   Take 50 mg by mouth every 6 (six) hours as needed.         Marland Kitchen. HYDROcodone-acetaminophen (NORCO/VICODIN) 5-325 MG per tablet   Oral   Take 2 tablets by mouth every 4 (four) hours as needed for pain.   10 tablet   0   . HYDROcodone-acetaminophen (NORCO/VICODIN) 5-325 MG per tablet   Oral   Take 1-2 tablets by mouth every 4 (four) hours as needed for moderate pain.   20 tablet   0   . hydrocortisone (ANUSOL-HC) 2.5 % rectal cream      Apply rectally 2 times daily   30 g   0   . hydrocortisone cream 1 %   Topical   Apply 1 application topically 2 (two) times daily.         Marland Kitchen. ibuprofen (ADVIL,MOTRIN) 200 MG tablet   Oral   Take 200-800 mg by mouth every 6 (six) hours as needed. For pain         .  naproxen (NAPROSYN) 500 MG tablet   Oral   Take 1 tablet (500 mg total) by mouth 2 (two) times daily with a meal.   20 tablet   0   . predniSONE (DELTASONE) 10 MG tablet      Six day step down   21 tablet   0   . traMADol (ULTRAM) 50 MG tablet   Oral   Take 1 tablet (50 mg total) by mouth every 6 (six) hours as needed.   20 tablet   0     Allergies Penicillins  History reviewed. No pertinent family history.  Social History Social History  Substance Use Topics  . Smoking status: Current Every Day Smoker -- 0.50 packs/day    Types: Cigarettes  . Smokeless tobacco: Never Used  . Alcohol Use: Yes     Comment: socially    Review of Systems Constitutional: No fever/chills Eyes: No visual changes. ENT: No sore throat. Cardiovascular: Denies chest pain. Respiratory: Denies shortness of breath. Gastrointestinal: No abdominal pain.  No nausea, no vomiting.  No diarrhea.  No constipation. Genitourinary: Negative for dysuria. Musculoskeletal: Left lateral knee pain Skin: Negative for  rash. Neurological: Negative for headaches, focal weakness or numbness. 10-point ROS otherwise negative.  ____________________________________________   PHYSICAL EXAM:  VITAL SIGNS: ED Triage Vitals  Enc Vitals Group     BP 10/23/14 1500 108/69 mmHg     Pulse Rate 10/23/14 1500 95     Resp 10/23/14 1500 18     Temp 10/23/14 1500 98.4 F (36.9 C)     Temp Source 10/23/14 1500 Oral     SpO2 10/23/14 1500 95 %     Weight 10/23/14 1500 245 lb (111.131 kg)     Height 10/23/14 1500 6' (1.829 m)     Head Cir --      Peak Flow --      Pain Score 10/23/14 1504 10     Pain Loc --      Pain Edu? --      Excl. in GC? --     Constitutional: Alert and oriented. Well appearing and in no acute distress. Eyes: Conjunctivae are normal. PERRL. EOMI. Head: Atraumatic. Nose: No congestion/rhinnorhea. Mouth/Throat: Mucous membranes are moist.  Oropharynx non-erythematous. Neck: No stridor.  No  cervical spine tenderness to palpation. Hematological/Lymphatic/Immunilogical: No cervical lymphadenopathy. Cardiovascular: Normal rate, regular rhythm. Grossly normal heart sounds.  Good peripheral circulation. Respiratory: Normal respiratory effort.  No retractions. Lungs CTAB. Gastrointestinal: Soft and nontender. No distention. No abdominal bruits. No CVA tenderness. Musculoskeletal: No obvious edema or erythema to the left knee. Patient has some moderate guarding palpation is increased crepitus with palpation. There is no laxity with valgus or varus stress testing. Patient does ambulate for atypical gait favoring the left lower extremity. Neurologic:  Normal speech and language. No gross focal neurologic deficits are appreciated. No gait instability. Skin:  Skin is warm, dry and intact. No rash noted. Psychiatric: Mood and affect are normal. Speech and behavior are normal.  ____________________________________________   LABS (all labs ordered are listed, but only abnormal results are displayed)  Labs Reviewed - No data to display ____________________________________________  EKG   ____________________________________________  RADIOLOGY  No acute findings on left knee x-ray. I, Joni Reining, personally viewed and evaluated these images (plain radiographs) as part of my medical decision making.   ____________________________________________   PROCEDURES  Procedure(s) performed: None  Critical Care performed: No  ____________________________________________   INITIAL IMPRESSION / ASSESSMENT AND PLAN / ED COURSE  Pertinent labs & imaging results that were available during my care of the patient were reviewed by me and considered in my medical decision making (see chart for details).   Sprain left knee. Patient placed in knee immobilizer 2-3 days. Patient given prescription for ibuprofen. Patient given a work note for 2 days. Patient [facility] condition/ complaint persists ____________________________________________   FINAL CLINICAL IMPRESSION(S) / ED DIAGNOSES  Final diagnoses:  Sprain of left knee, initial encounter      IRENE MITCHAM, PA-C 10/23/14 1626  Phineas Semen, MD 10/24/14 [phone]

## 2014-10-23 NOTE — Discharge Instructions (Signed)
Wear knee support for 2-3 days.

## 2015-03-23 ENCOUNTER — Emergency Department
Admission: EM | Admit: 2015-03-23 | Discharge: 2015-03-23 | Disposition: A | Discharge status: Home / Self Care | Payer: Self-pay | Attending: Emergency Medicine | Admitting: Emergency Medicine

## 2015-03-23 ENCOUNTER — Encounter: Payer: Self-pay | Admitting: *Deleted

## 2015-03-23 ENCOUNTER — Emergency Department: Payer: Self-pay

## 2015-03-23 DIAGNOSIS — F17210 Nicotine dependence, cigarettes, uncomplicated: Secondary | ICD-10-CM | POA: Insufficient documentation

## 2015-03-23 DIAGNOSIS — Z880 Allergy status to penicillin: Secondary | ICD-10-CM | POA: Insufficient documentation

## 2015-03-23 DIAGNOSIS — M1651 Unilateral post-traumatic osteoarthritis, right hip: Secondary | ICD-10-CM | POA: Insufficient documentation

## 2015-03-23 DIAGNOSIS — M25551 Pain in right hip: Secondary | ICD-10-CM

## 2015-03-23 MED ORDER — MELOXICAM 15 MG PO TABS: 15.0000 mg | ORAL_TABLET | Freq: Every day | ORAL | Status: DC

## 2015-03-23 NOTE — ED Notes (Signed)
Presents with  Left lower leg swelling started on and off for one week but worse this am with some pain.  Edema noted to left lower leg. Pt with hx of right hip surgery, pt states right lower leg is swollen too but not as bad as the left. Pt  Had surgery in [place] 2008.

## 2015-03-23 NOTE — Discharge Instructions (Signed)
Began taking Mobic  daily with food. Follow-up with Dr. Ernest PineHooten if any continued problems.

## 2015-03-23 NOTE — ED Notes (Signed)
Pt complains of right hip pain and left foot pain starting 3 days ago

## 2015-03-23 NOTE — ED Provider Notes (Signed)
[facility] Emergency Department Provider Note  ____________________________________________  Time seen: Approximately 10:16 AM  I have reviewed the triage vital signs and the nursing notes.   HISTORY  Chief Complaint Foot Pain and Hip Pain   HPI Andrew Snow is a 35 y.o. male is here with complaint of right hip pain off and on for one week that has gotten worse and last 3 days. Patient states that he has not had an injury to his leg and is unsure why that is hurting. He states he had hip surgery, right hip, and 2008 in [place]. He has been taking Tylenol "every 2 hours" for his pain. He continues to ambulate with his pain. He also has noticed that both legs have been swollen, he denies eating salty foods.He rates his pain as a 9/10.   History reviewed. No pertinent past medical history.  There are no active problems to display for this patient.   Past Surgical History  Procedure Laterality Date  . Appendectomy    . Hip fracture surgery      r/hip  . Right hip surgery      Current Outpatient Rx  Name  Route  Sig  Dispense  Refill  . clindamycin (CLEOCIN) 150 MG capsule   Oral   Take 2 capsules (300 mg total) by mouth 3 (three) times daily. X 10 days   60 capsule   0   . diphenhydrAMINE (BENADRYL) 25 MG tablet   Oral   Take 50 mg by mouth every 6 (six) hours as needed.         . hydrocortisone (ANUSOL-HC) 2.5 % rectal cream      Apply rectally 2 times daily   30 g   0   . hydrocortisone cream 1 %   Topical   Apply 1 application topically 2 (two) times daily.         . meloxicam (MOBIC) 15 MG tablet   Oral   Take 1 tablet (15 mg total) by mouth daily.   30 tablet   2     Allergies Penicillins  No family history on file.  Social History Social History  Substance Use Topics  . Smoking status: Current Every Day Smoker -- 0.50 packs/day    Types: Cigarettes  . Smokeless tobacco: Never Used  . Alcohol Use: Yes   Comment: socially    Review of Systems Constitutional: No fever/chills Cardiovascular: Denies chest pain. Respiratory: Denies shortness of breath. Musculoskeletal: Positive right hip pain. Positive bilateral ankle edema. Skin: Negative for rash. Neurological: Negative for headaches, focal weakness or numbness.  10-point ROS otherwise negative.  ____________________________________________   PHYSICAL EXAM:  VITAL SIGNS: ED Triage Vitals  Enc Vitals Group     BP 03/23/15 0951 121/67 mmHg     Pulse Rate 03/23/15 0951 87     Resp 03/23/15 0951 20     Temp 03/23/15 0951 97.9 F (36.6 C)     Temp Source 03/23/15 0951 Oral     SpO2 03/23/15 0951 97 %     Weight 03/23/15 0951 240 lb (108.863 kg)     Height 03/23/15 0951 6' (1.829 m)     Head Cir --      Peak Flow --      Pain Score 03/23/15 0952 9     Pain Loc --      Pain Edu? --      Excl. in GC? --     Constitutional: Alert and oriented.  Well appearing and in no acute distress. Eyes: Conjunctivae are normal. PERRL. EOMI. Head: Atraumatic. Nose: No congestion/rhinnorhea. Neck: No stridor.   Cardiovascular: Normal rate, regular rhythm. Grossly normal heart sounds.  Good peripheral circulation. Respiratory: Normal respiratory effort.  No retractions. Lungs CTAB. Musculoskeletal: Moderate tenderness on palpation of the right hip both posterior, lateral and anterior. Range of motion reproduces his pain with flexion, extension, and abduction. No gross deformity was noted. There is some nonpitting edema bilateral ankles. Ankles are nontender. Neurologic:  Normal speech and language. No gross focal neurologic deficits are appreciated. No gait instability. Skin:  Skin is warm, dry and intact. No rash noted. Psychiatric: Mood and affect are normal. Speech and behavior are normal.  ____________________________________________   LABS (all labs ordered are listed, but only abnormal results are displayed)  Labs Reviewed - No data  to display  RADIOLOGY X-ray of the right hip per radiologist shows asymmetric mild to moderate degenerative arthropathy the. Patient does have a plate and screw fixation from a prior fracture.  ____________________________________________   PROCEDURES  Procedure(s) performed: None  Critical Care performed: No  ____________________________________________   INITIAL IMPRESSION / ASSESSMENT AND PLAN / ED COURSE  Pertinent labs & imaging results that were available during my care of the patient were reviewed by me and considered in my medical decision making (see chart for details).  Patient was informed of his arthritic changes in his right hip. Patient is follow-up with Dr. Ernest PineHooten if any continued problems but will start on Mobic  to be taken with food daily. He will make an appointment with the orthopedic department if any continued problems. ____________________________________________   FINAL CLINICAL IMPRESSION(S) / ED DIAGNOSES  Final diagnoses:  Hip pain, right  Post-traumatic osteoarthritis of right hip      Tommi RumpsRhonda L Trasean Delima, PA-C 03/23/15 1157  Phineas SemenGraydon Goodman, MD 03/23/15 [phone]

## 2015-03-23 NOTE — ED Notes (Signed)
Pt discharged home after verbalizing understanding of discharge instructions; nad noted. 

## 2016-02-10 ENCOUNTER — Emergency Department (HOSPITAL_COMMUNITY)
Admission: EM | Admit: 2016-02-10 | Discharge: 2016-02-11 | Disposition: A | Discharge status: Home / Self Care | Payer: No Typology Code available for payment source | Attending: Emergency Medicine | Admitting: Emergency Medicine

## 2016-02-10 ENCOUNTER — Encounter (HOSPITAL_COMMUNITY): Payer: Self-pay

## 2016-02-10 ENCOUNTER — Emergency Department (HOSPITAL_COMMUNITY): Payer: No Typology Code available for payment source

## 2016-02-10 DIAGNOSIS — S199XXA Unspecified injury of neck, initial encounter: Secondary | ICD-10-CM | POA: Diagnosis not present

## 2016-02-10 DIAGNOSIS — M546 Pain in thoracic spine: Secondary | ICD-10-CM | POA: Insufficient documentation

## 2016-02-10 DIAGNOSIS — M25561 Pain in right knee: Secondary | ICD-10-CM | POA: Diagnosis not present

## 2016-02-10 DIAGNOSIS — M25551 Pain in right hip: Secondary | ICD-10-CM | POA: Insufficient documentation

## 2016-02-10 DIAGNOSIS — Y939 Activity, unspecified: Secondary | ICD-10-CM | POA: Diagnosis not present

## 2016-02-10 DIAGNOSIS — Y92410 Unspecified street and highway as the place of occurrence of the external cause: Secondary | ICD-10-CM | POA: Insufficient documentation

## 2016-02-10 DIAGNOSIS — R51 Headache: Secondary | ICD-10-CM | POA: Insufficient documentation

## 2016-02-10 DIAGNOSIS — Z79899 Other long term (current) drug therapy: Secondary | ICD-10-CM | POA: Insufficient documentation

## 2016-02-10 DIAGNOSIS — R079 Chest pain, unspecified: Secondary | ICD-10-CM | POA: Diagnosis not present

## 2016-02-10 DIAGNOSIS — M542 Cervicalgia: Secondary | ICD-10-CM

## 2016-02-10 DIAGNOSIS — F17210 Nicotine dependence, cigarettes, uncomplicated: Secondary | ICD-10-CM | POA: Insufficient documentation

## 2016-02-10 DIAGNOSIS — Y999 Unspecified external cause status: Secondary | ICD-10-CM | POA: Diagnosis not present

## 2016-02-10 DIAGNOSIS — M25512 Pain in left shoulder: Secondary | ICD-10-CM | POA: Insufficient documentation

## 2016-02-10 LAB — CBC WITH DIFFERENTIAL/PLATELET
BASOS PCT: 0 %
Basophils Absolute: 0 10*3/uL (ref 0.0–0.1)
EOS ABS: 0.5 10*3/uL (ref 0.0–0.7)
Eosinophils Relative: 7 %
HEMATOCRIT: 42.6 % (ref 39.0–52.0)
HEMOGLOBIN: 14.1 g/dL (ref 13.0–17.0)
LYMPHS ABS: 2.7 10*3/uL (ref 0.7–4.0)
Lymphocytes Relative: 37 %
MCH: 28.4 pg (ref 26.0–34.0)
MCHC: 33.1 g/dL (ref 30.0–36.0)
MCV: 85.9 fL (ref 78.0–100.0)
MONO ABS: 0.4 10*3/uL (ref 0.1–1.0)
MONOS PCT: 6 %
NEUTROS ABS: 3.5 10*3/uL (ref 1.7–7.7)
Neutrophils Relative %: 50 %
Platelets: 202 10*3/uL (ref 150–400)
RBC: 4.96 MIL/uL (ref 4.22–5.81)
RDW: 15.9 % — AB (ref 11.5–15.5)
WBC: 7.2 10*3/uL (ref 4.0–10.5)

## 2016-02-10 LAB — COMPREHENSIVE METABOLIC PANEL
ALBUMIN: 3.5 g/dL (ref 3.5–5.0)
ALK PHOS: 72 U/L (ref 38–126)
ALT: 61 U/L (ref 17–63)
ANION GAP: 10 (ref 5–15)
AST: 54 U/L — ABNORMAL HIGH (ref 15–41)
BILIRUBIN TOTAL: 0.4 mg/dL (ref 0.3–1.2)
BUN: 7 mg/dL (ref 6–20)
CALCIUM: 8.7 mg/dL — AB (ref 8.9–10.3)
CO2: 25 mmol/L (ref 22–32)
Chloride: 109 mmol/L (ref 101–111)
Creatinine, Ser: 0.93 mg/dL (ref 0.61–1.24)
GFR calc non Af Amer: >60 mL/min (ref >60)
GLUCOSE: 107 mg/dL — AB (ref 65–99)
POTASSIUM: 3.6 mmol/L (ref 3.5–5.1)
Sodium: 144 mmol/L (ref 135–145)
TOTAL PROTEIN: 6.5 g/dL (ref 6.5–8.1)

## 2016-02-10 LAB — PROTIME-INR
INR: 1.12
Prothrombin Time: 14.4 seconds (ref 11.4–15.2)

## 2016-02-10 MED ORDER — HYDROMORPHONE HCL 2 MG/ML IJ SOLN
1.0000 mg | Freq: Once | INTRAMUSCULAR | Status: AC
  Administered 2016-02-10: 1 mg via INTRAVENOUS
  Filled 2016-02-10: qty 1
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
## 2016-02-11 ENCOUNTER — Emergency Department (HOSPITAL_COMMUNITY): Payer: No Typology Code available for payment source

## 2016-02-11 ENCOUNTER — Other Ambulatory Visit (HOSPITAL_COMMUNITY): Payer: Self-pay | Admitting: Radiology

## 2016-02-11 MED ORDER — CYCLOBENZAPRINE HCL 10 MG PO TABS: 10.0000 mg | ORAL_TABLET | Freq: Two times a day (BID) | ORAL | 0 refills | Status: DC | PRN

## 2016-02-11 MED ORDER — IOPAMIDOL (ISOVUE-300) INJECTION 61%
INTRAVENOUS | Status: AC
  Administered 2016-02-11: 100 mL
  Filled 2016-02-11: qty 100

## 2016-02-11 MED ORDER — IOPAMIDOL (ISOVUE-300) INJECTION 61%: 100.0000 mL | Freq: Once | INTRAVENOUS | Status: DC | PRN

## 2016-02-11 MED ORDER — HYDROCODONE-ACETAMINOPHEN 5-325 MG PO TABS: 1.0000 | ORAL_TABLET | Freq: Four times a day (QID) | ORAL | 0 refills | Status: DC | PRN

## 2016-02-11 NOTE — Discharge Instructions (Signed)
Please continue to wear the cervical collar until you follow up with Neurosurgery in 1 week. You must call to schedule an appointment. Please return to the ED if you have numbness or tingling in your arms or legs, or weakness, or any other concerning symptoms.

## 2016-07-02 ENCOUNTER — Emergency Department
Admission: EM | Admit: 2016-07-02 | Discharge: 2016-07-02 | Disposition: A | Discharge status: Home / Self Care | Payer: BC Managed Care – PPO | Attending: Emergency Medicine | Admitting: Emergency Medicine

## 2016-07-02 ENCOUNTER — Emergency Department: Payer: BC Managed Care – PPO

## 2016-07-02 ENCOUNTER — Encounter: Payer: Self-pay | Admitting: Emergency Medicine

## 2016-07-02 DIAGNOSIS — F17210 Nicotine dependence, cigarettes, uncomplicated: Secondary | ICD-10-CM | POA: Diagnosis not present

## 2016-07-02 DIAGNOSIS — R1030 Lower abdominal pain, unspecified: Secondary | ICD-10-CM | POA: Diagnosis present

## 2016-07-02 DIAGNOSIS — M79672 Pain in left foot: Secondary | ICD-10-CM | POA: Diagnosis not present

## 2016-07-02 DIAGNOSIS — R110 Nausea: Secondary | ICD-10-CM | POA: Insufficient documentation

## 2016-07-02 DIAGNOSIS — R197 Diarrhea, unspecified: Secondary | ICD-10-CM | POA: Insufficient documentation

## 2016-07-02 DIAGNOSIS — R1084 Generalized abdominal pain: Secondary | ICD-10-CM

## 2016-07-02 LAB — COMPREHENSIVE METABOLIC PANEL
ALK PHOS: 74 U/L (ref 38–126)
ALT: 66 U/L — ABNORMAL HIGH (ref 17–63)
ANION GAP: 6 (ref 5–15)
AST: 50 U/L — ABNORMAL HIGH (ref 15–41)
Albumin: 3.8 g/dL (ref 3.5–5.0)
BUN: 10 mg/dL (ref 6–20)
CALCIUM: 8.8 mg/dL — AB (ref 8.9–10.3)
CO2: 22 mmol/L (ref 22–32)
Chloride: 109 mmol/L (ref 101–111)
Creatinine, Ser: 0.71 mg/dL (ref 0.61–1.24)
GFR calc non Af Amer: >60 mL/min (ref >60)
Glucose, Bld: 101 mg/dL — ABNORMAL HIGH (ref 65–99)
Potassium: 3.9 mmol/L (ref 3.5–5.1)
SODIUM: 137 mmol/L (ref 135–145)
TOTAL PROTEIN: 7.6 g/dL (ref 6.5–8.1)
Total Bilirubin: 0.6 mg/dL (ref 0.3–1.2)

## 2016-07-02 LAB — URINALYSIS, COMPLETE (UACMP) WITH MICROSCOPIC
BILIRUBIN URINE: NEGATIVE
Bacteria, UA: NONE SEEN
GLUCOSE, UA: NEGATIVE mg/dL
HGB URINE DIPSTICK: NEGATIVE
KETONES UR: NEGATIVE mg/dL
LEUKOCYTES UA: NEGATIVE
NITRITE: NEGATIVE
PROTEIN: NEGATIVE mg/dL
Specific Gravity, Urine: 1.014 (ref 1.005–1.030)
Squamous Epithelial / LPF: NONE SEEN
pH: 5 (ref 5.0–8.0)

## 2016-07-02 LAB — CBC
HCT: 42.3 % (ref 40.0–52.0)
HEMOGLOBIN: 14.1 g/dL (ref 13.0–18.0)
MCH: 28.7 pg (ref 26.0–34.0)
MCHC: 33.3 g/dL (ref 32.0–36.0)
MCV: 86.4 fL (ref 80.0–100.0)
Platelets: 217 10*3/uL (ref 150–440)
RBC: 4.89 MIL/uL (ref 4.40–5.90)
RDW: 15.1 % — ABNORMAL HIGH (ref 11.5–14.5)
WBC: 10.5 10*3/uL (ref 3.8–10.6)

## 2016-07-02 LAB — LIPASE, BLOOD: Lipase: 31 U/L (ref 11–51)

## 2016-07-02 MED ORDER — ONDANSETRON 4 MG PO TBDP
8.0000 mg | ORAL_TABLET | Freq: Once | ORAL | Status: AC
  Administered 2016-07-02: 8 mg via ORAL
  Filled 2016-07-02: qty 2

## 2016-07-02 MED ORDER — LOPERAMIDE HCL 2 MG PO CAPS
4.0000 mg | ORAL_CAPSULE | Freq: Once | ORAL | Status: AC
  Administered 2016-07-02: 4 mg via ORAL
  Filled 2016-07-02: qty 2

## 2016-07-02 MED ORDER — CIPROFLOXACIN HCL 500 MG PO TABS: 500.0000 mg | ORAL_TABLET | Freq: Two times a day (BID) | ORAL | 0 refills | Status: AC

## 2016-07-02 MED ORDER — ONDANSETRON 4 MG PO TBDP: 4.0000 mg | ORAL_TABLET | Freq: Three times a day (TID) | ORAL | 0 refills | Status: DC | PRN

## 2016-07-02 NOTE — Discharge Instructions (Signed)
Please take all of your antibiotics as prescribed and return to the emergency department for any new or worsening symptoms such as worsening pain, fevers, chills, if you cannot eat or drink, or for any other concerns. Please make an appointment to follow-up with podiatry as needed.  It was a pleasure to take care of you today, and thank you for coming to our emergency department.  If you have any questions or concerns before leaving please ask the nurse to grab me and I'm more than happy to go through your aftercare instructions again.  If you were prescribed any opioid pain medication today such as Norco, Vicodin, Percocet, morphine, hydrocodone, or oxycodone please make sure you do not drive when you are taking this medication as it can alter your ability to drive safely.  If you have any concerns once you are home that you are not improving or are in fact getting worse before you can make it to your follow-up appointment, please do not hesitate to call [phone] and come back for further evaluation.  Merrily BrittleNeil Kimba Lottes MD  Results for orders placed or performed during the hospital encounter of 07/02/16  Lipase, blood  Result Value Ref Range   Lipase 31 11 - 51 U/L  Comprehensive metabolic panel  Result Value Ref Range   Sodium 137 135 - 145 mmol/L   Potassium 3.9 3.5 - 5.1 mmol/L   Chloride 109 101 - 111 mmol/L   CO2 22 22 - 32 mmol/L   Glucose, Bld 101 (H) 65 - 99 mg/dL   BUN 10 6 - 20 mg/dL   Creatinine, Ser 9.520.71 0.61 - 1.24 mg/dL   Calcium 8.8 (L) 8.9 - 10.3 mg/dL   Total Protein 7.6 6.5 - 8.1 g/dL   Albumin 3.8 3.5 - 5.0 g/dL   AST 50 (H) 15 - 41 U/L   ALT 66 (H) 17 - 63 U/L   Alkaline Phosphatase 74 38 - 126 U/L   Total Bilirubin 0.6 0.3 - 1.2 mg/dL   GFR calc non Af Amer >60 >60 mL/min   GFR calc Af Amer >60 >60 mL/min   Anion gap 6 5 - 15  CBC  Result Value Ref Range   WBC 10.5 3.8 - 10.6 K/uL   RBC 4.89 4.40 - 5.90 MIL/uL   Hemoglobin 14.1 13.0 - 18.0 g/dL   HCT 84.142.3 32.440.0 -  40.152.0 %   MCV 86.4 80.0 - 100.0 fL   MCH 28.7 26.0 - 34.0 pg   MCHC 33.3 32.0 - 36.0 g/dL   RDW 02.715.1 (H) 25.311.5 - 66.414.5 %   Platelets 217 150 - 440 K/uL  Urinalysis, Complete w Microscopic  Result Value Ref Range   Color, Urine YELLOW (A) YELLOW   APPearance CLEAR (A) CLEAR   Specific Gravity, Urine 1.014 1.005 - 1.030   pH 5.0 5.0 - 8.0   Glucose, UA NEGATIVE NEGATIVE mg/dL   Hgb urine dipstick NEGATIVE NEGATIVE   Bilirubin Urine NEGATIVE NEGATIVE   Ketones, ur NEGATIVE NEGATIVE mg/dL   Protein, ur NEGATIVE NEGATIVE mg/dL   Nitrite NEGATIVE NEGATIVE   Leukocytes, UA NEGATIVE NEGATIVE   RBC / HPF 0-5 0 - 5 RBC/hpf   WBC, UA 0-5 0 - 5 WBC/hpf   Bacteria, UA NONE SEEN NONE SEEN   Squamous Epithelial / LPF NONE SEEN NONE SEEN   Mucous PRESENT    Dg Foot Complete Left  Result Date: 07/02/2016 CLINICAL DATA:  Two months of left lateral foot pain with no known  injury with increased symptoms over the past 2-3 days made worse with weight-bearing. EXAM: LEFT FOOT - COMPLETE 3+ VIEW COMPARISON:  None in PACs FINDINGS: The bones are subjectively adequately mineralized. There is no acute fracture nor dislocation. The soft tissues are unremarkable. Specific attention to the lateral aspect of the foot reveals no abnormalities. IMPRESSION: There is no acute bony abnormality of the left foot. Electronically Signed   By: David  [place] M.D.   On: 07/02/2016 13:06

## 2016-07-02 NOTE — ED Provider Notes (Signed)
[facility] Emergency Department Provider Note  ____________________________________________   First MD Initiated Contact with Patient 07/02/16 1241     (approximate)  I have reviewed the triage vital signs and the nursing notes.   HISTORY  Chief Complaint Foot Injury and Abdominal Pain   HPI Andrew Snow is a 36 y.o. male who comes to the emergency department with 2 issues. First he is noted about 3 days of mild severity cramping lower abdominal discomfort associated with several loose watery stools a day. Associated with nausea but no vomiting. He's had no recent antibiotics. No recent travel. No recent camping. He does not drink stream water. He has a remote past surgical history of appendectomy.He's had no fevers or chills. He also notes about 2 months of moderate severity aching left lateral foot pain. Worse when walking and improved with rest. He says he had x-rays previously was evaluated by an orthopedic surgeon who said that there is no fracture nothing to do. He is frustrated that his pain persists.   History reviewed. No pertinent past medical history.  There are no active problems to display for this patient.   Past Surgical History:  Procedure Laterality Date  . APPENDECTOMY    . HIP FRACTURE SURGERY     r/hip  . right hip surgery      Prior to Admission medications   Medication Sig Start Date End Date Taking? Authorizing Provider  ciprofloxacin (CIPRO) 500 MG tablet Take 1 tablet (500 mg total) by mouth 2 (two) times daily. 07/02/16 07/05/16  Merrily Brittle, MD  cyclobenzaprine (FLEXERIL) 10 MG tablet Take 1 tablet (10 mg total) by mouth 2 (two) times daily as needed for muscle spasms. 02/11/16   Page, Nathan Italy, MD  HYDROcodone-acetaminophen (NORCO/VICODIN) 5-325 MG tablet Take 1 tablet by mouth every 6 (six) hours as needed. 02/11/16   Page, Nathan Italy, MD  ondansetron (ZOFRAN ODT) 4 MG disintegrating tablet Take 1 tablet (4 mg total)  by mouth every 8 (eight) hours as needed for nausea or vomiting. 07/02/16   Merrily Brittle, MD    Allergies Amoxicillin and Penicillins  No family history on file.  Social History Social History  Substance Use Topics  . Smoking status: Current Some Day Smoker    Packs/day: 0.50    Types: Cigarettes  . Smokeless tobacco: Never Used  . Alcohol use Yes     Comment: socially    Review of Systems Constitutional: No fever/chills Eyes: No visual changes. ENT: No sore throat. Cardiovascular: Denies chest pain. Respiratory: Denies shortness of breath. Gastrointestinal: Positive abdominal pain.  Positive nausea, no vomiting.  Positive diarrhea.  No constipation. Genitourinary: Negative for dysuria. Musculoskeletal: Negative for back pain. Skin: Negative for rash. Neurological: Negative for headaches, focal weakness or numbness.   ____________________________________________   PHYSICAL EXAM:  VITAL SIGNS: ED Triage Vitals  Enc Vitals Group     BP 07/02/16 0955 119/73     Pulse Rate 07/02/16 0955 73     Resp 07/02/16 0955 18     Temp 07/02/16 0955 97.6 F (36.4 C)     Temp Source 07/02/16 0955 Oral     SpO2 07/02/16 0955 98 %     Weight 07/02/16 0955 250 lb (113.4 kg)     Height 07/02/16 0955 6' (1.829 m)     Head Circumference --      Peak Flow --      Pain Score 07/02/16 0957 8     Pain Loc --  Pain Edu? --      Excl. in GC? --     Constitutional: Alert and oriented 4 very well-appearing joking and laughing speaks in full clear sentences Eyes: PERRL EOMI. Head: Atraumatic. Nose: No congestion/rhinnorhea. Mouth/Throat: No trismus Neck: No stridor.   Cardiovascular: Normal rate, regular rhythm. Grossly normal heart sounds.  Good peripheral circulation. Respiratory: Normal respiratory effort.  No retractions. Lungs CTAB and moving good air Gastrointestinal: Soft nondistended mild lower abdominal tenderness with no rebound or guarding no  peritonitis Musculoskeletal: No bony tenderness of his left foot. No tenderness over fifth metatarsal, mid foot, proximal or medial malleolus or for 6 cm proximal neurovascularly intact no tenderness over the plantar fascia. He is tender over plantar surface of the foot laterally Neurologic:  Normal speech and language. No gross focal neurologic deficits are appreciated. Skin:  Skin is warm, dry and intact. No rash noted. Psychiatric: Mood and affect are normal. Speech and behavior are normal.    ____________________________________________  ____________________________________________   LABS (all labs ordered are listed, but only abnormal results are displayed)  Labs Reviewed  COMPREHENSIVE METABOLIC PANEL - Abnormal; Notable for the following:       Result Value   Glucose, Bld 101 (*)    Calcium 8.8 (*)    AST 50 (*)    ALT 66 (*)    All other components within normal limits  CBC - Abnormal; Notable for the following:    RDW 15.1 (*)    All other components within normal limits  URINALYSIS, COMPLETE (UACMP) WITH MICROSCOPIC - Abnormal; Notable for the following:    Color, Urine YELLOW (*)    APPearance CLEAR (*)    All other components within normal limits  LIPASE, BLOOD    Labs unremarkable __________________________________________  EKG   ____________________________________________  RADIOLOGY  X-ray with no acute disease of the foot ____________________________________________   PROCEDURES  Procedure(s) performed: no  Procedures  Critical Care performed: no  Observation: no ____________________________________________   INITIAL IMPRESSION / ASSESSMENT AND PLAN / ED COURSE  Pertinent labs & imaging results that were available during my care of the patient were reviewed by me and considered in my medical decision making (see chart for details).  Regarding the patient's foot pain. He has no signs of fracture dislocation is able to ambulate. He is  focally tender over the soft tissue and this likely represents a tendinitis versus other soft tissue injury. I will refer him to podiatry outpatient and encouraged him to continue NSAIDs. Regarding his abdominal pain it is likely secondary to infectious diarrhea. I'll treat him a 3 day course of ciprofloxacin as well as Zofran for symptomatic relief and over-the-counter loperamide. He is discharged home in improved condition with strict return precautions given.      ____________________________________________   FINAL CLINICAL IMPRESSION(S) / ED DIAGNOSES  Final diagnoses:  Generalized abdominal pain  Diarrhea of presumed infectious origin  Left foot pain      NEW MEDICATIONS STARTED DURING THIS VISIT:  Discharge Medication List as of 07/02/2016  2:39 PM    START taking these medications   Details  ciprofloxacin (CIPRO) 500 MG tablet Take 1 tablet (500 mg total) by mouth 2 (two) times daily., Starting Mon 07/02/2016, Until Thu 07/05/2016, Print    ondansetron (ZOFRAN ODT) 4 MG disintegrating tablet Take 1 tablet (4 mg total) by mouth every 8 (eight) hours as needed for nausea or vomiting., Starting Mon 07/02/2016, Print  Note:  This document was prepared using Dragon voice recognition software and may include unintentional dictation errors.     Merrily Brittle, MD 07/03/16 1308

## 2016-07-02 NOTE — ED Triage Notes (Signed)
L foot pain x 2 months, denies injury. Ler abd pain and diarrhea x 3 days.

## 2016-09-24 ENCOUNTER — Encounter (HOSPITAL_COMMUNITY): Payer: Self-pay

## 2016-09-24 ENCOUNTER — Emergency Department (HOSPITAL_COMMUNITY)
Admission: EM | Admit: 2016-09-24 | Discharge: 2016-09-25 | Disposition: A | Discharge status: Other Acute Inpatient Hospital | Payer: BC Managed Care – PPO | Attending: Emergency Medicine | Admitting: Emergency Medicine

## 2016-09-24 DIAGNOSIS — T5492XA Toxic effect of unspecified corrosive substance, intentional self-harm, initial encounter: Secondary | ICD-10-CM

## 2016-09-24 DIAGNOSIS — F17210 Nicotine dependence, cigarettes, uncomplicated: Secondary | ICD-10-CM | POA: Insufficient documentation

## 2016-09-24 DIAGNOSIS — R45851 Suicidal ideations: Secondary | ICD-10-CM | POA: Insufficient documentation

## 2016-09-24 DIAGNOSIS — T6592XA Toxic effect of unspecified substance, intentional self-harm, initial encounter: Secondary | ICD-10-CM

## 2016-09-24 DIAGNOSIS — T540X2A Toxic effect of phenol and phenol homologues, intentional self-harm, initial encounter: Secondary | ICD-10-CM | POA: Insufficient documentation

## 2016-09-24 DIAGNOSIS — T1491XA Suicide attempt, initial encounter: Secondary | ICD-10-CM

## 2016-09-24 HISTORY — DX: Suicidal ideations: R45.851

## 2016-09-24 HISTORY — DX: Suicide attempt, initial encounter: T14.91XA

## 2016-09-24 LAB — ACETAMINOPHEN LEVEL

## 2016-09-24 LAB — CBC WITH DIFFERENTIAL/PLATELET
Basophils Absolute: 0 10*3/uL (ref 0.0–0.1)
Basophils Relative: 0 %
EOS ABS: 0.2 10*3/uL (ref 0.0–0.7)
Eosinophils Relative: 2 %
HCT: 42.9 % (ref 39.0–52.0)
HEMOGLOBIN: 14.6 g/dL (ref 13.0–17.0)
LYMPHS ABS: 2.6 10*3/uL (ref 0.7–4.0)
Lymphocytes Relative: 36 %
MCH: 28.9 pg (ref 26.0–34.0)
MCHC: 34 g/dL (ref 30.0–36.0)
MCV: 85 fL (ref 78.0–100.0)
Monocytes Absolute: 0.5 10*3/uL (ref 0.1–1.0)
Monocytes Relative: 6 %
NEUTROS PCT: 56 %
Neutro Abs: 4 10*3/uL (ref 1.7–7.7)
Platelets: 221 10*3/uL (ref 150–400)
RBC: 5.05 MIL/uL (ref 4.22–5.81)
RDW: 14.5 % (ref 11.5–15.5)
WBC: 7.3 10*3/uL (ref 4.0–10.5)

## 2016-09-24 LAB — COMPREHENSIVE METABOLIC PANEL
ALT: 81 U/L — ABNORMAL HIGH (ref 17–63)
ANION GAP: 8 (ref 5–15)
AST: 69 U/L — ABNORMAL HIGH (ref 15–41)
Albumin: 4.3 g/dL (ref 3.5–5.0)
Alkaline Phosphatase: 75 U/L (ref 38–126)
BUN: 9 mg/dL (ref 6–20)
CHLORIDE: 107 mmol/L (ref 101–111)
CO2: 23 mmol/L (ref 22–32)
Calcium: 9.2 mg/dL (ref 8.9–10.3)
Creatinine, Ser: 0.81 mg/dL (ref 0.61–1.24)
GFR calc Af Amer: >60 mL/min (ref >60)
GFR calc non Af Amer: >60 mL/min (ref >60)
Glucose, Bld: 84 mg/dL (ref 65–99)
POTASSIUM: 3.3 mmol/L — AB (ref 3.5–5.1)
SODIUM: 138 mmol/L (ref 135–145)
Total Bilirubin: 0.9 mg/dL (ref 0.3–1.2)
Total Protein: 8.3 g/dL — ABNORMAL HIGH (ref 6.5–8.1)

## 2016-09-24 LAB — ETHANOL: Alcohol, Ethyl (B): <5 mg/dL (ref <5)
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
## 2016-09-24 NOTE — ED Notes (Signed)
TTS started  

## 2016-09-24 NOTE — ED Notes (Addendum)
Attempted to change pt out of bleach stained clothes and obtain VS. Pt refuses. Pt continues to state "I don't feel safe, I fell like in these handcuffs y'all can do whatever you want to me." Assured pt that nothing will be done to him at this time without his consent. Pt states "I refuse everything until you take these cuffs off." Explained to pt he would remain in handcuffs until after provider assessment and potentially longer to ensure the safety of this RN, ED staff, and also pt. Dr. Rush Landmark notified.

## 2016-09-24 NOTE — ED Notes (Signed)
Poison control notified of ingestion. Pt reports ingesting regular Clorox bleach, denies drinking "Ultra bleach" or scented bleach. Per Revonda Standard at poison control, if pt does not vomit after approx 2 hours since ingestion a PO challenge is recommended. Revonda Standard also reports regular bleach is typically not as poisonous as scented or "ultra bleach". [employer] reports pt has not vomited since being in custody. Pt refuses to answer any of this RNs questions at this time. Will notify Dr. Rush Landmark.

## 2016-09-24 NOTE — ED Triage Notes (Addendum)
Pt via [employer] for drinking bleach around 4 PM this afternoon. Per [employer], officers were called by pt's friend who reports pt was drinking bleach and making comments about wanting to hurt himself. Pt reportedly making statements about wanting to "run into traffic" and "cut himself." Pt expressing suicidal ideation and plan at this time. States "you have no right to keep me here. I have the right to hurt myself if I want. You can't stop me." Attempted therapeutic communication. [employer] at bedside. Unsure of exact amount of bleach ingested, pt states "I only drank a little." Bleach noted to pt shirt and breath.

## 2016-09-24 NOTE — ED Notes (Signed)
IVC papers served on patient by [employer]. Dr. Rush Landmark at the bedside at this time.

## 2016-09-24 NOTE — ED Notes (Signed)
Per staffing, no sitter available at this time. Will speak with charge RN. Pt changed into paper scrubs witnessed by this RN. Belongings at the nursing station.
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
## 2016-09-24 NOTE — ED Notes (Signed)
Dinner tray ordered.

## 2016-09-24 NOTE — BH Assessment (Signed)
Donell Sievert NP recommends inpatient treatment. TTS to look for placement

## 2016-09-25 ENCOUNTER — Encounter (HOSPITAL_COMMUNITY): Payer: Self-pay | Admitting: *Deleted

## 2016-09-25 ENCOUNTER — Inpatient Hospital Stay (HOSPITAL_COMMUNITY)
Admission: AD | Admit: 2016-09-25 | Discharge: 2016-10-01 | DRG: 885 | Disposition: A | Discharge status: Home / Self Care | Payer: Federal, State, Local not specified - Other | Source: Intra-hospital | Attending: Psychiatry | Admitting: Psychiatry

## 2016-09-25 DIAGNOSIS — F419 Anxiety disorder, unspecified: Secondary | ICD-10-CM | POA: Diagnosis present

## 2016-09-25 DIAGNOSIS — F410 Panic disorder [episodic paroxysmal anxiety] without agoraphobia: Secondary | ICD-10-CM | POA: Diagnosis present

## 2016-09-25 DIAGNOSIS — Z560 Unemployment, unspecified: Secondary | ICD-10-CM | POA: Diagnosis not present

## 2016-09-25 DIAGNOSIS — Z23 Encounter for immunization: Secondary | ICD-10-CM

## 2016-09-25 DIAGNOSIS — F4310 Post-traumatic stress disorder, unspecified: Secondary | ICD-10-CM | POA: Diagnosis present

## 2016-09-25 DIAGNOSIS — F17210 Nicotine dependence, cigarettes, uncomplicated: Secondary | ICD-10-CM | POA: Diagnosis present

## 2016-09-25 DIAGNOSIS — R4584 Anhedonia: Secondary | ICD-10-CM | POA: Diagnosis not present

## 2016-09-25 DIAGNOSIS — G4700 Insomnia, unspecified: Secondary | ICD-10-CM | POA: Diagnosis present

## 2016-09-25 DIAGNOSIS — Z599 Problem related to housing and economic circumstances, unspecified: Secondary | ICD-10-CM | POA: Diagnosis not present

## 2016-09-25 DIAGNOSIS — Z880 Allergy status to penicillin: Secondary | ICD-10-CM

## 2016-09-25 DIAGNOSIS — Z915 Personal history of self-harm: Secondary | ICD-10-CM

## 2016-09-25 DIAGNOSIS — T1491XA Suicide attempt, initial encounter: Secondary | ICD-10-CM | POA: Diagnosis not present

## 2016-09-25 DIAGNOSIS — F332 Major depressive disorder, recurrent severe without psychotic features: Secondary | ICD-10-CM | POA: Diagnosis present

## 2016-09-25 DIAGNOSIS — R197 Diarrhea, unspecified: Secondary | ICD-10-CM | POA: Diagnosis present

## 2016-09-25 DIAGNOSIS — T5492XA Toxic effect of unspecified corrosive substance, intentional self-harm, initial encounter: Secondary | ICD-10-CM | POA: Diagnosis not present

## 2016-09-25 LAB — URINALYSIS, ROUTINE W REFLEX MICROSCOPIC
BACTERIA UA: NONE SEEN
Bilirubin Urine: NEGATIVE
GLUCOSE, UA: NEGATIVE mg/dL
HGB URINE DIPSTICK: NEGATIVE
Ketones, ur: 20 mg/dL — AB
LEUKOCYTES UA: NEGATIVE
NITRITE: NEGATIVE
PH: 5 (ref 5.0–8.0)
PROTEIN: 30 mg/dL — AB
SPECIFIC GRAVITY, URINE: 1.033 — AB (ref 1.005–1.030)

## 2016-09-25 LAB — RAPID URINE DRUG SCREEN, HOSP PERFORMED
Amphetamines: NOT DETECTED
Barbiturates: NOT DETECTED
Benzodiazepines: POSITIVE — AB
Cocaine: NOT DETECTED
OPIATES: NOT DETECTED
Tetrahydrocannabinol: POSITIVE — AB

## 2016-09-25 MED ORDER — ACETAMINOPHEN 325 MG PO TABS
650.0000 mg | ORAL_TABLET | Freq: Four times a day (QID) | ORAL | Status: DC | PRN
  Administered 2016-09-26 – 2016-09-27 (×2): 650 mg via ORAL
  Filled 2016-09-25 (×2): qty 2

## 2016-09-25 MED ORDER — TRAZODONE HCL 50 MG PO TABS
50.0000 mg | ORAL_TABLET | Freq: Every evening | ORAL | Status: DC | PRN
  Administered 2016-09-26 – 2016-09-30 (×4): 50 mg via ORAL
  Filled 2016-09-25 (×4): qty 1
  Filled 2016-09-25: qty 7

## 2016-09-25 MED ORDER — PNEUMOCOCCAL VAC POLYVALENT 25 MCG/0.5ML IJ INJ
0.5000 mL | INJECTION | INTRAMUSCULAR | Status: AC
  Administered 2016-09-26: 0.5 mL via INTRAMUSCULAR

## 2016-09-25 MED ORDER — ALUM & MAG HYDROXIDE-SIMETH 200-200-20 MG/5ML PO SUSP
30.0000 mL | ORAL | Status: DC | PRN
  Administered 2016-09-26 – 2016-10-01 (×2): 30 mL via ORAL
  Filled 2016-09-25 (×2): qty 30

## 2016-09-25 MED ORDER — INFLUENZA VAC SPLIT QUAD 0.5 ML IM SUSY
0.5000 mL | PREFILLED_SYRINGE | INTRAMUSCULAR | Status: AC
  Administered 2016-09-26: 0.5 mL via INTRAMUSCULAR
  Filled 2016-09-25: qty 0.5

## 2016-09-25 MED ORDER — HYDROXYZINE HCL 25 MG PO TABS
25.0000 mg | ORAL_TABLET | Freq: Three times a day (TID) | ORAL | Status: DC | PRN
  Administered 2016-09-26 – 2016-09-30 (×4): 25 mg via ORAL
  Filled 2016-09-25: qty 10
  Filled 2016-09-25 (×4): qty 1

## 2016-09-25 NOTE — Tx Team (Signed)
Initial Treatment Plan 09/25/2016 9:17 PM [name] ZOX:[id number]    PATIENT STRESSORS: Other: No stressors identified   PATIENT STRENGTHS: Average or above average intelligence Financial means General fund of knowledge   PATIENT IDENTIFIED PROBLEMS: At risk for suicide  "I want to go home"  "I just want to go home as soon as possible"                 DISCHARGE CRITERIA:  Ability to meet basic life and health needs Improved stabilization in mood, thinking, and/or behavior Motivation to continue treatment in a less acute level of care Need for constant or close observation no longer present  PRELIMINARY DISCHARGE PLAN: Outpatient therapy Return to previous living arrangement  PATIENT/FAMILY INVOLVEMENT: This treatment plan has been presented to and reviewed with the patient, Andrew Snow.  The patient and family have been given the opportunity to ask questions and make suggestions.  Carleene Overlie, RN 09/25/2016, 9:17 PM

## 2016-09-25 NOTE — ED Notes (Signed)
Report given.  Room ready at 2000

## 2016-09-25 NOTE — BH Assessment (Signed)
Pt is cooperative and oriented x 4. He denies SI and HI. Pt denies [facility] and no delusions noted. He reports he tried to commit suicide last night by drinking bleach. Pt reports 3 to 4 suicide attempt in total. His affect is blunted. Leighton Ruff NP recommends inpatient treatment. Pt slept well and he ate breakfast.   Evette Cristal, LCSW Therapeutic Triage Specialist

## 2016-09-25 NOTE — ED Notes (Signed)
Dee called [facility] and confirmed they received fax of IVC paperwork

## 2016-09-25 NOTE — ED Notes (Signed)
Poison Control called, took information and closed case

## 2016-09-25 NOTE — Progress Notes (Signed)
Pt accepted to [facility] bed 405-1. Donell Sievert, PA, accepting provider. Nehemiah Massed, MD, attending provider. Nurse report to [phone]. Pt scheduled to arrive at 8:00 PM.   CSW notified [facility] Psych ED Nurse, Arline Asp.  Timmothy Euler. Kaylyn Lim, MSW, LCSWA Disposition Clinical Social Work [phone] (cell) [phone] (office)

## 2016-09-26 DIAGNOSIS — T1491XA Suicide attempt, initial encounter: Secondary | ICD-10-CM

## 2016-09-26 DIAGNOSIS — R110 Nausea: Secondary | ICD-10-CM

## 2016-09-26 DIAGNOSIS — R197 Diarrhea, unspecified: Secondary | ICD-10-CM

## 2016-09-26 DIAGNOSIS — Z62810 Personal history of physical and sexual abuse in childhood: Secondary | ICD-10-CM

## 2016-09-26 DIAGNOSIS — G4700 Insomnia, unspecified: Secondary | ICD-10-CM

## 2016-09-26 DIAGNOSIS — T5492XA Toxic effect of unspecified corrosive substance, intentional self-harm, initial encounter: Secondary | ICD-10-CM

## 2016-09-26 DIAGNOSIS — R4584 Anhedonia: Secondary | ICD-10-CM

## 2016-09-26 DIAGNOSIS — F332 Major depressive disorder, recurrent severe without psychotic features: Principal | ICD-10-CM

## 2016-09-26 MED ORDER — NICOTINE 21 MG/24HR TD PT24
21.0000 mg | MEDICATED_PATCH | Freq: Every day | TRANSDERMAL | Status: DC
  Administered 2016-09-26 – 2016-09-27 (×2): 21 mg via TRANSDERMAL
  Filled 2016-09-26 (×7): qty 1

## 2016-09-26 MED ORDER — SERTRALINE HCL 50 MG PO TABS
50.0000 mg | ORAL_TABLET | Freq: Every day | ORAL | Status: DC
  Administered 2016-09-27 – 2016-09-28 (×2): 50 mg via ORAL
  Filled 2016-09-26 (×4): qty 1

## 2016-09-26 MED ORDER — POTASSIUM CHLORIDE CRYS ER 10 MEQ PO TBCR
10.0000 meq | EXTENDED_RELEASE_TABLET | Freq: Two times a day (BID) | ORAL | Status: AC
  Administered 2016-09-26 – 2016-09-27 (×3): 10 meq via ORAL
  Filled 2016-09-26 (×4): qty 1

## 2016-09-26 NOTE — BHH Group Notes (Signed)
[facility] Group Therapy 09/26/2016 1:15pm  Type of Therapy: Mental Health Association Presentation  Pt did not attend, declined invitation.    Verdene Lennert, LCSW 09/26/2016 4:22 PM
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
## 2016-09-26 NOTE — Progress Notes (Signed)
D: Patient received influenza and pneumonia injection and tolerated well.  He reports poor sleep and appetite; low energy and poor concentration.  He rates his depression and hopelessness as a 3; anxiety as a 5.  He denies any thoughts of self harm.  Patient drank bleach prior to admission and IVC'd due to attempting to leave the ED.  Patient presents with flat affect; he is guarded and no forthcoming with information about his stressors.   A: Continue to monitor medication management and MD orders.  Safety checks completed every 15 minutes per protocol.  Offer support and encouragement as needed. R: Patient is isolative and withdrawn.

## 2016-09-26 NOTE — Progress Notes (Signed)
Patient did not attend wrap up group. 

## 2016-09-26 NOTE — Plan of Care (Signed)
Problem: Safety: Goal: Periods of time without injury will increase Outcome: Progressing Patient is on q15 minute safety checks and low fall risk precautions. Patient contracts for safety on the unit and remains safe at this time.   

## 2016-09-26 NOTE — [facility] Suicide Risk Assessment (Signed)
[facility] INPATIENT:  Family/Significant Other Suicide Prevention Education  Suicide Prevention Education:  Patient Refusal for Family/Significant Other Suicide Prevention Education: The patient Andrew Snow has refused to provide written consent for family/significant other to be provided Family/Significant Other Suicide Prevention Education during admission and/or prior to discharge.  Physician notified.  Verdene Lennert 09/26/2016, 2:02 PM

## 2016-09-26 NOTE — Progress Notes (Signed)
Recreation Therapy Notes  Date: 09/26/16 Time: 0930 Location: [address] Dayroom  Group Topic: Stress Management  Goal Area(s) Addresses:  Patient will verbalize importance of using healthy stress management.  Patient will identify positive emotions associated with healthy stress management.   Intervention: Stress Management  Activity :  Progressive Muscle Relaxation.  LRT introduced the stress management technique of progressive muscle relaxation.  LRT read a script that instructed patients to tense then relax each muscle group individually.  Patients were to follow along as the script was read to fully engage in the practice.  Education:  Stress Management, Discharge Planning.   Education Outcome: Acknowledges edcuation/In group clarification offered/Needs additional education  Clinical Observations/Feedback: Pt did not attend group.    Caroll Rancher, LRT/CTRS         Caroll Rancher A 09/26/2016 12:17 PM

## 2016-09-26 NOTE — Progress Notes (Signed)
Admission Note:  36 year old male who presents IVC, in no acute distress, for the treatment of SI and Depression. Patient reports, prior to admission, he drank bleach in a suicide attempt.  Additionally, patient states he had a plan to "walk in traffic".  Patient is angry and agitated on admission. Patient verbalizes that he does not want to be here [facility]) and is interacting minimally.  Patient continues to endorse SI and contracts for safety upon admission. Patient denies AVH . Patient verbalizes that he does not feel safe at [facility], without reason, and would prefer to be alone.  Patient is unable to identify any stressors and states that he does not have a support system. Skin was assessed and found to be clear of any abnormal marks.  Patient searched and no contraband found, POC and unit policies explained and understanding verbalized. Consents obtained. Food and fluids offered and accepted. Patient had no additional questions or concerns.
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
## 2016-09-27 LAB — HEPATIC FUNCTION PANEL
ALT: 90 U/L — ABNORMAL HIGH (ref 17–63)
AST: 76 U/L — ABNORMAL HIGH (ref 15–41)
Albumin: 4.1 g/dL (ref 3.5–5.0)
Alkaline Phosphatase: 80 U/L (ref 38–126)
BILIRUBIN TOTAL: 0.4 mg/dL (ref 0.3–1.2)
Total Protein: 7.6 g/dL (ref 6.5–8.1)

## 2016-09-27 LAB — TSH: TSH: 0.704 u[IU]/mL (ref 0.350–4.500)

## 2016-09-27 MED ORDER — IBUPROFEN 400 MG PO TABS
400.0000 mg | ORAL_TABLET | Freq: Four times a day (QID) | ORAL | Status: DC | PRN
  Administered 2016-09-28 – 2016-09-30 (×3): 400 mg via ORAL
  Filled 2016-09-27 (×3): qty 1

## 2016-09-27 MED ORDER — GABAPENTIN 100 MG PO CAPS
100.0000 mg | ORAL_CAPSULE | Freq: Three times a day (TID) | ORAL | Status: DC
  Administered 2016-09-27 – 2016-09-28 (×4): 100 mg via ORAL
  Filled 2016-09-27 (×9): qty 1

## 2016-09-27 NOTE — Progress Notes (Signed)
D: Patient presents with flat, blunted affect and depressed mood.  Patient remains somewhat isolative to room.  His blood pressure has improved since yesterday.  Patient minimizes his attempt to harm himself stating, "I just drank a little bleach."  He rates his depression, hopelessness and anxiety as a 2.  He denies any thoughts of self harm.  He denies HI and does not appear to be responding to internal stimuli.  Patient is not attending many groups. A: Continue to monitor medication management and MD orders.  Safety checks continued every 15 minutes per protocol.  Offer support and encouragement as needed. R: Patient is isolative and withdrawn.
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
## 2016-09-27 NOTE — Progress Notes (Signed)
Patient did not attend wrap up group. 

## 2016-09-28 MED ORDER — GABAPENTIN 100 MG PO CAPS
200.0000 mg | ORAL_CAPSULE | Freq: Three times a day (TID) | ORAL | Status: DC
  Administered 2016-09-29 – 2016-09-30 (×5): 200 mg via ORAL
  Filled 2016-09-28 (×10): qty 2

## 2016-09-28 MED ORDER — SERTRALINE HCL 50 MG PO TABS
75.0000 mg | ORAL_TABLET | Freq: Every day | ORAL | Status: DC
  Administered 2016-09-29 – 2016-10-01 (×3): 75 mg via ORAL
  Filled 2016-09-28 (×5): qty 1

## 2016-09-28 MED ORDER — LOPERAMIDE HCL 2 MG PO CAPS
2.0000 mg | ORAL_CAPSULE | Freq: Four times a day (QID) | ORAL | Status: DC | PRN
  Administered 2016-09-28: 2 mg via ORAL
  Filled 2016-09-28: qty 1

## 2016-09-28 NOTE — Progress Notes (Signed)
Patient attended group and said that his day was a 7.  His coping skills were sleeping and socializing.

## 2016-09-28 NOTE — [facility] Group Notes (Signed)
LCSW Group Therapy Note  09/28/2016 1:15pm  Type of Therapy and Topic:  Group Therapy:  Feelings around Relapse and Recovery  Participation Level:  None   Description of Group:    Patients in this group will discuss emotions they experience before and after a relapse. They will process how experiencing these feelings, or avoidance of experiencing them, relates to having a relapse. Facilitator will guide patients to explore emotions they have related to recovery. Patients will be encouraged to process which emotions are more powerful. They will be guided to discuss the emotional reaction significant others in their lives may have to their relapse or recovery. Patients will be assisted in exploring ways to respond to the emotions of others without this contributing to a relapse.  Therapeutic Goals: 1. Patient will identify two or more emotions that lead to a relapse for them 2. Patient will identify two emotions that result when they relapse 3. Patient will identify two emotions related to recovery 4. Patient will demonstrate ability to communicate their needs through discussion and/or role plays   Summary of Patient Progress: Pt did not participate in group discussion but was attentive throughout.     Therapeutic Modalities:   Cognitive Behavioral Therapy Solution-Focused Therapy Assertiveness Training Relapse Prevention Therapy   Verdene Lennert, LCSW 09/28/2016 5:11 PM

## 2016-09-28 NOTE — Plan of Care (Signed)
Problem: Safety: Goal: Periods of time without injury will increase Outcome: Progressing Patient is on q15 minute safety checks and low fall risk precautions. Patient contracts for safety on the unit and remains safe at this time.   

## 2016-09-28 NOTE — Tx Team (Signed)
Interdisciplinary Treatment and Diagnostic Plan Update  09/28/2016 Time of Session: 9:30am Andrew Snow MRN: [id number]  Principal Diagnosis: MDD, severe, no psychotic symptoms  Secondary Diagnoses: Active Problems:   MDD (major depressive disorder), recurrent severe, without psychosis ([facility])   Current Medications:  Current Facility-Administered Medications  Medication Dose Route Frequency Provider Last Rate Last Dose  . alum & mag hydroxide-simeth (MAALOX/MYLANTA) 200-200-20 MG/5ML suspension 30 mL  30 mL Oral Q4H PRN Okonkwo, Justina A, NP   30 mL at 09/26/16 0834  . gabapentin (NEURONTIN) capsule 100 mg  100 mg Oral TID Cobos, Rockey Situ, MD   100 mg at 09/28/16 1211  . hydrOXYzine (ATARAX/VISTARIL) tablet 25 mg  25 mg Oral TID PRN Ferne Reus A, NP   25 mg at 09/26/16 2147  . ibuprofen (ADVIL,MOTRIN) tablet 400 mg  400 mg Oral Q6H PRN Cobos, Fernando A, MD      . loperamide (IMODIUM) capsule 2 mg  2 mg Oral Q6H PRN Money, Gerlene Burdock, FNP   2 mg at 09/28/16 1538  . nicotine (NICODERM CQ - dosed in mg/24 hours) patch 21 mg  21 mg Transdermal Daily Cobos, Rockey Situ, MD   21 mg at 09/27/16 4098  . sertraline (ZOLOFT) tablet 50 mg  50 mg Oral Daily Cobos, Rockey Situ, MD   50 mg at 09/28/16 0900  . traZODone (DESYREL) tablet 50 mg  50 mg Oral QHS PRN Beryle Lathe, Justina A, NP   50 mg at 09/26/16 2147    PTA Medications: Prescriptions Prior to Admission  Medication Sig Dispense Refill Last Dose  . cyclobenzaprine (FLEXERIL) 10 MG tablet Take 1 tablet (10 mg total) by mouth 2 (two) times daily as needed for muscle spasms. (Patient not taking: Reported on 09/24/2016) 20 tablet 0 Not Taking at Unknown time  . HYDROcodone-acetaminophen (NORCO/VICODIN) 5-325 MG tablet Take 1 tablet by mouth every 6 (six) hours as needed. (Patient not taking: Reported on 09/24/2016) 15 tablet 0 Not Taking at Unknown time  . ondansetron (ZOFRAN ODT) 4 MG disintegrating tablet Take 1 tablet (4 mg total) by mouth  every 8 (eight) hours as needed for nausea or vomiting. (Patient not taking: Reported on 09/24/2016) 20 tablet 0 Not Taking at Unknown time    Treatment Modalities: Medication Management, Group therapy, Case management,  1 to 1 session with clinician, Psychoeducation, Recreational therapy.  Patient Stressors: Other: No stressors identified  Patient Strengths: Average or above average intelligence Financial means General fund of knowledge  Physician Treatment Plan for Primary Diagnosis: MDD, severe, no psychotic symptoms Long Term Goal(s): Improvement in symptoms so as ready for discharge  Short Term Goals: Ability to identify changes in lifestyle to reduce recurrence of condition will improve Ability to maintain clinical measurements within normal limits will improve Ability to verbalize feelings will improve Ability to disclose and discuss suicidal ideas Ability to demonstrate self-control will improve Ability to identify and develop effective coping behaviors will improve Ability to maintain clinical measurements within normal limits will improve  Medication Management: Evaluate patient's response, side effects, and tolerance of medication regimen.  Therapeutic Interventions: 1 to 1 sessions, Unit Group sessions and Medication administration.  Evaluation of Outcomes: Progressing  Physician Treatment Plan for Secondary Diagnosis: Active Problems:   MDD (major depressive disorder), recurrent severe, without psychosis (HCC)   Long Term Goal(s): Improvement in symptoms so as ready for discharge  Short Term Goals: Ability to identify changes in lifestyle to reduce recurrence of condition will improve Ability to maintain clinical  measurements within normal limits will improve Ability to verbalize feelings will improve Ability to disclose and discuss suicidal ideas Ability to demonstrate self-control will improve Ability to identify and develop effective coping behaviors will  improve Ability to maintain clinical measurements within normal limits will improve  Medication Management: Evaluate patient's response, side effects, and tolerance of medication regimen.  Therapeutic Interventions: 1 to 1 sessions, Unit Group sessions and Medication administration.  Evaluation of Outcomes: Progressing   RN Treatment Plan for Primary Diagnosis: MDD, severe, no psychotic symptoms Long Term Goal(s): Knowledge of disease and therapeutic regimen to maintain health will improve  Short Term Goals: Ability to verbalize frustration and anger appropriately will improve, Ability to disclose and discuss suicidal ideas and Compliance with prescribed medications will improve  Medication Management: RN will administer medications as ordered by provider, will assess and evaluate patient's response and provide education to patient for prescribed medication. RN will report any adverse and/or side effects to prescribing provider.  Therapeutic Interventions: 1 on 1 counseling sessions, Psychoeducation, Medication administration, Evaluate responses to treatment, Monitor vital signs and CBGs as ordered, Perform/monitor CIWA, COWS, AIMS and Fall Risk screenings as ordered, Perform wound care treatments as ordered.  Evaluation of Outcomes: Progressing   LCSW Treatment Plan for Primary Diagnosis: MDD, severe, no psychotic symptoms Long Term Goal(s): Safe transition to appropriate next level of care at discharge, Engage patient in therapeutic group addressing interpersonal concerns.  Short Term Goals: Engage patient in aftercare planning with referrals and resources and Increase skills for wellness and recovery  Therapeutic Interventions: Assess for all discharge needs, 1 to 1 time with Social worker, Explore available resources and support systems, Assess for adequacy in community support network, Educate family and significant other(s) on suicide prevention, Complete Psychosocial Assessment,  Interpersonal group therapy.  Evaluation of Outcomes: Progressing   Progress in Treatment: Attending groups: Yes  Participating in groups: Minimally  Taking medication as prescribed: Yes, MD continues to assess for medication changes as needed Toleration medication: Yes, no side effects reported at this time Family/Significant other contact made: No, Pt declines Patient understands diagnosis: Difficult to assess due to limited engagement Discussing patient identified problems/goals with staff: Yes Medical problems stabilized or resolved: Yes Denies suicidal/homicidal ideation: No, endorses passive SI Issues/concerns per patient self-inventory: None Other: N/A  New problem(s) identified: None identified at this time.   New Short Term/Long Term Goal(s): "be okay with the unknown"  Discharge Plan or Barriers: Pt will return home and follow-up with outpatient services with [facility]  Reason for Continuation of Hospitalization: Anxiety Depression Medication stabilization  Estimated Length of Stay: 2-3 days; est DC date 9/24  Attendees: Patient: Andrew Snow 09/28/2016  5:16 PM  Physician:  09/28/2016  5:16 PM  Nursing: Lincoln Maxin RN 09/28/2016  5:16 PM  RN Care Manager:  09/28/2016  5:16 PM  Social Worker: Vernie Shanks, LCSW 09/28/2016  5:16 PM  Recreational Therapist:  09/28/2016  5:16 PM  Other:  09/28/2016  5:16 PM  Other:  09/28/2016  5:16 PM  Other: 09/28/2016  5:16 PM    Scribe for Treatment Team: Verdene Lennert, LCSW 09/28/2016 5:16 PM

## 2016-09-28 NOTE — Progress Notes (Signed)
Writer has observed patient up in the dayroom watching tv but not interacting with peers. Writer spoke with him 1:1 and he reports feeling better since being here and the medication he has been taking for his hip pain has been helpful. He reports that he was hooked on percocet before and did not like how it made him feel so now he reports that at home he just tolerates the pain. He reports that the Neurontin helps him stay in control unlike the percocet. He plan to stay with his mom for a little while and then go home. Support given and safety maintained on unit with 15 min checks.

## 2016-09-28 NOTE — Progress Notes (Signed)
Recreation Therapy Notes  Date: 09/28/16 Time: 0930 Location: [address] Group Room  Group Topic: Stress Management  Goal Area(s) Addresses:  Patient will verbalize importance of using healthy stress management.  Patient will identify positive emotions associated with healthy stress management.   Intervention: Stress Management  Activity :  Guided Imagery.  LRT introduced the stress management technique of guided imagery to patients.  LRT read a script that allowed patients to embark on a mental vacation to relax and get away from their everyday routine.  Patients were to follow along as the script was read to participate in the activity.  Education:  Stress Management, Discharge Planning.   Education Outcome: Acknowledges edcuation/In group clarification offered/Needs additional education  Clinical Observations/Feedback: Pt did not attend group.   Caroll Rancher, LRT/CTRS         Caroll Rancher A 09/28/2016 11:34 AM
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
## 2016-09-28 NOTE — Progress Notes (Signed)
Nursing Progress Note 1900-0730  D) Patient presents with flat affect and depressed mood. Patient is resting in bed for entirety of this shift and is minimal with staff. Patient isolating to room and denies concerns for Clinical research associate. Patient did not request medications this evening. Patient reports thoughts of SI but no plan and contracts for safety on the unit. Patient denies HI/AVH or pain.   A) Emotional support given. 1:1 interaction and active listening provided. No medications scheduled/requested. Medications and plan of care reviewed with patient. Patient verbalized understanding without further questions. Snacks and fluids provided. Opportunities for questions or concerns presented to patient. Patient encouraged to continue to work on treatment goals. Labs, vital signs and patient behavior monitored throughout shift. Patient safety maintained with q15 min safety checks. Low fall risk precautions in place and reviewed with patient; patient verbalized understanding.  R) Patient receptive to interaction with nurse. Patient remains safe on the unit at this time. Patient denies any adverse medication reactions at this time. Patient is resting in bed without complaints. Will continue to monitor.

## 2016-09-28 NOTE — Progress Notes (Signed)
D: Pt A & O X4. Denies SI, HI, AVH and pain when assessed. Per pt "I feel much better now, I'm thinking of going home". Visible in milieu at intervals during shift. Pt did not attend unit groups as encouraged. C/O of diarrhea this shift "I don't eat dairy products, this is what happens when I do, I have loose stools".  A: All medications administered as prescribed with verbal education and effects monitored. Provider notified of pt's c/o loose stools, new order received for Immodium (see EMAR). Encouraged pt to voice concerns, comply with unit routines including groups and treatment plan. Q 15 minutes safety checks maintained without self harm gestures or outburst to note thus far.  R: Pt receptive to care. Takes his medications without issues when offered. Per pt's report,  Immodium was effective. Denies adverse drug reactions or concerns at this time. Off unit for recreation time with peers. Tolerates all PO intake well. Remains safe on and off unit.

## 2016-09-29 DIAGNOSIS — F1290 Cannabis use, unspecified, uncomplicated: Secondary | ICD-10-CM

## 2016-09-29 DIAGNOSIS — F419 Anxiety disorder, unspecified: Secondary | ICD-10-CM

## 2016-09-29 DIAGNOSIS — F17210 Nicotine dependence, cigarettes, uncomplicated: Secondary | ICD-10-CM

## 2016-09-29 LAB — HEPATITIS C ANTIBODY

## 2016-09-29 NOTE — Progress Notes (Signed)
Patient has been up and active on the unit, observed playing UNO with peers, laughing and talking. He attended group this evening and requested his medication shortly after 2100.  Patient currently denies si/hi/a/v hall. Support and encouragement offered, safety maintained on unit, will continue to monitor.

## 2016-09-29 NOTE — [facility] Group Notes (Signed)
[facility] Group Notes:  (Nursing/MHT/Case Management/Adjunct)  Date:  09/29/2016  Time:  1330  Type of Therapy:  Nurse Education  - Improving Self Esteem and Positive Self Talk  Participation Level:  Active  Participation Quality:  Attentive  Affect:  Blunted  Cognitive:  Alert  Insight:  UTA - patient did not contribute to discussion  Engagement in Group:  Improving  Modes of Intervention:  Discussion and Education  Summary of Progress/Problems: Patient attended group and was attentive however did not participate.   Merian Capron [facility] 09/29/2016, 1415
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
## 2016-09-29 NOTE — Progress Notes (Signed)
D: Pt A & O X4. Denies SI and HI AVH. Isolative to his room majority of this shift. Per pt "I'm ok with my Neurontin, I still feel some pain here and there, the pain does not go away completely but at least I'm able to move around, I rather deal with this than being hooked on Percocet". Rates his depression, hopelessness and anxiety all 0/10 on self inventory sheet. Goal for today is to learn some coping skills "Listen to how others cope". Expressed excitement about possible d/c home on Monday "I can't wait, I feel ready to go now". A: All medications administered as per MD's orders with verbal education and effects monitored. Pt encouraged to voice concerns and attend groups on unit as scheduled. Q 15 minutes safety checks mtained without self harm gestures or outburst. R: Tolerates all PO intake well. Remains medication compliant. Denies adverse drug reactions at this time.  Pt did not attend group this AM when prompted. Safety maintained on and off unit.

## 2016-09-29 NOTE — Progress Notes (Signed)
Patient attended group and said that his day was a 8.  Something positive was that his Mom visited him today.

## 2016-09-29 NOTE — [facility] Group Notes (Signed)
[facility] LCSW Group Therapy Note  09/29/2016  10:10 to 11 AM  Type of Therapy and Topic:  Group Therapy: Avoiding Self-Sabotaging and Enabling Behaviors  Participation Level:  Did Not Attend; invited to participate yet did not despite overhead announcement and encouragement by staff   Joleigh Mineau C Jamarco Zaldivar, LCSW   

## 2016-09-30 MED ORDER — GABAPENTIN 300 MG PO CAPS
300.0000 mg | ORAL_CAPSULE | Freq: Three times a day (TID) | ORAL | Status: DC
  Administered 2016-09-30 – 2016-10-01 (×4): 300 mg via ORAL
  Filled 2016-09-30: qty 1
  Filled 2016-09-30: qty 21
  Filled 2016-09-30 (×4): qty 1
  Filled 2016-09-30 (×2): qty 21

## 2016-09-30 MED ORDER — NICOTINE POLACRILEX 2 MG MT GUM
2.0000 mg | CHEWING_GUM | OROMUCOSAL | Status: DC | PRN
  Administered 2016-09-30: 2 mg via ORAL
  Filled 2016-09-30: qty 1

## 2016-09-30 NOTE — [facility] Group Notes (Signed)
Psychoeducational Group Note  Date:  09/30/2016 Time:  3:36 PM  Group Topic/Focus:  Developing a Wellness Toolbox:   The focus of this group is to help patients develop a "wellness toolbox" with skills and strategies to promote recovery upon discharge.  Participation Level:  Active  Participation Quality:  Appropriate and Attentive  Affect:  Appropriate  Cognitive:  Alert and Appropriate  Insight:  Appropriate  Engagement in Group:  Engaged  Modes of Intervention:  Discussion and Problem-solving  Additional Comments:  Patient attended and participated in group.  In today's group, patients discussed and were asked to write down current stressors in their lives and coping skills/tools that they could utilize to cope with stressors.   Andrew Snow 09/30/2016, 3:36 PM  
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
## 2016-09-30 NOTE — [facility] Group Notes (Signed)
[facility] LCSW Group Therapy Note    09/30/2016 at 10:15 until 11 AM  Type of Therapy and Topic: Group Therapy: Feelings Around Returning Home & Establishing a Supportive Framework and How to Practice Self care in Daily Life  Participation Level: Did Not Attend, invited to participate yet did not despite overhead announcement and encouragement by staff     Catherine C Harrill, LCSW  

## 2016-09-30 NOTE — Progress Notes (Signed)
D- Patient alert and oriented. Patient is in a depressed mood with a flat affect.  Patient currently denies SI, HI, AVH, and pain.  Patient reports that he slept "good" last night.  Patient reports "I feel a lot better now than when I came".  Patient is currently observed in the Dayroom interacting well with peers. No complaints at this time.   A- Scheduled medications administered to patient, per MD orders. Support and encouragement provided.  Routine safety checks conducted every 15 minutes.  Patient informed to notify staff with problems or concerns. R- No adverse drug reactions noted. Patient contracts for safety at this time. Patient compliant with medications and treatment plan. Patient receptive, calm, and cooperative. Patient remains safe at this time.

## 2016-09-30 NOTE — Progress Notes (Signed)
Patient has been observed up talking with Daryl Eastern. In the dayroom. He reports that he is hopeful to discharge on tomorrow He reports that his mother plans to pick him up and he wants a cigarette first thing.  Patient currently denies si/hi/a/v hall. Support and encouragement offered, safety maintained on unit, will continue to monitor.

## 2016-10-01 LAB — ALT: ALT: 101 U/L — ABNORMAL HIGH (ref 17–63)

## 2016-10-01 LAB — AST: AST: 88 U/L — ABNORMAL HIGH (ref 15–41)

## 2016-10-01 MED ORDER — SERTRALINE HCL 25 MG PO TABS: 75.0000 mg | ORAL_TABLET | Freq: Every day | ORAL | 0 refills | Status: DC

## 2016-10-01 MED ORDER — GABAPENTIN 300 MG PO CAPS: 300.0000 mg | ORAL_CAPSULE | Freq: Three times a day (TID) | ORAL | 0 refills | Status: DC

## 2016-10-01 MED ORDER — SERTRALINE HCL 25 MG PO TABS
75.0000 mg | ORAL_TABLET | Freq: Every day | ORAL | Status: DC
  Filled 2016-10-01 (×2): qty 21

## 2016-10-01 MED ORDER — TRAZODONE HCL 50 MG PO TABS: 50.0000 mg | ORAL_TABLET | Freq: Every evening | ORAL | 0 refills | Status: DC | PRN

## 2016-10-01 MED ORDER — NICOTINE POLACRILEX 2 MG MT GUM: 2.0000 mg | CHEWING_GUM | OROMUCOSAL | 0 refills | Status: DC | PRN

## 2016-10-01 NOTE — Progress Notes (Signed)
Patient ID: Andrew Snow, male   DOB: March 01, 1980, 36 y.o.   MRN: [id number]  Discharge Note- Belongings returned to patient at time of discharge. Discharge instructions and medications were reviewed with patient. Patient verbalized understanding of both medications and discharge instructions. Patient discharged to lobby where his ride was waiting. Q15 minute safety checks maintained until time of discharge. No distress noted or reported upon discharge.

## 2016-10-01 NOTE — [facility] Suicide Risk Assessment (Addendum)
[facility] Discharge Suicide Risk Assessment   Principal Problem: MDD (major depressive disorder), recurrent severe, without psychosis ([facility]) Discharge Diagnoses:  Patient Active Problem List   Diagnosis Date Noted  . MDD (major depressive disorder), recurrent severe, without psychosis ([facility]) [F33.2] 09/25/2016    Total Time spent with patient: 30 minutes  Musculoskeletal: Strength & Muscle Tone: within normal limits Gait & Station: normal Patient leans: N/A  Psychiatric Specialty Exam: ROS denies headache, no chest pain, no shortness of breath, no dysphagia, no precordial or abdominal pain,  No RUQ pain, no choluria, no acholia, no vomiting ,no diarrhea, no melenas , history of hip pain but does not appear uncomfortable or in any acute distress at this time  Blood pressure 117/65, pulse (!) 58, temperature 97.8 F (36.6 C), temperature source Oral, resp. rate 18, height 6' (1.829 m), weight 111.1 kg (245 lb).Body mass index is 33.23 kg/m.  General Appearance: improving grooming   Eye Contact::  Good  Speech:  Normal Rate409  Volume:  Normal  Mood:  denies feeling depressed and characterizes mood as "OK" today  Affect:  has become more reactive  Thought Process:  Linear and Descriptions of Associations: Intact  Orientation:  Full (Time, Place, and Person)  Thought Content:  denies hallucinations, no delusions, not internally preoccupied  Suicidal Thoughts:  No denies suicidal or self injurious ideations, denies homicidal or violent ideations  Homicidal Thoughts:  No  Memory:  recent and remote grossly intact   Judgement:  Other:  improving   Insight:  improving   Psychomotor Activity:  Normal  Concentration:  Good  Recall:  Good  Fund of Knowledge:Good  Language: Good  Akathisia:  Negative  Handed:  Right  AIMS (if indicated):     Assets:  Desire for Improvement Resilience  Sleep:  Number of Hours: 6.25  Cognition: WNL  ADL's:  Intact   Mental Status Per Nursing Assessment::    On Admission:  Suicidal ideation indicated by patient, Suicide plan, Plan includes specific time, place, or method, Self-harm thoughts, Self-harm behaviors  Demographic Factors:  36 year old single male, lives alone, unemployed, has a son who lives with the mother  Loss Factors: Unemployment, limited support network  Historical Factors: No prior psychiatric admissions, history of suicide attempt 3 years ago, history of self cutting as a teenager, no history of psychosis  Risk Reduction Factors:   Positive coping skills or problem solving skills  Continued Clinical Symptoms:  At this time patient is alert, attentive, calm, pleasant on approach, no thought disorder, describes mood as improved and denies feeling depressed, affect is more reactive, no suicidal ideations, no self injurious ideations, no homicidal or violent ideations, no psychotic symptoms, future oriented. Denies medication side effects. On Zoloft and Neurontin .  Of note, patient has persistently /mildly elevated AST and ALT- no associated symptoms, patient is asymptomatic. Hep C Ab negative. We discussed- this may be related to recent ingestion of bleach. It is unlikely related to current psychiatric medications but patient aware that liver toxicity can be a rare side effect to medications. At this time states medications are working well for him, not causing side effects, so will continue, but have reiterated importance of following up with PHP and to seek immediate treatment if any jaundice, choluria, acholia, RUQ or other severe symptoms.  Behavior on unit in good control, no disruptive behaviors.  Cognitive Features That Contribute To Risk:  No gross cognitive deficits noted upon discharge. Is alert , attentive, and oriented x  3    Suicide Risk:  Mild:  Suicidal ideation of limited frequency, intensity, duration, and specificity.  There are no identifiable plans, no associated intent, mild dysphoria and related symptoms,  good self-control (both objective and subjective assessment), few other risk factors, and identifiable protective factors, including available and accessible social support.  Follow-up Information    [facility] Follow up on 10/04/2016.   Specialty:  Behavioral Health Why:  Hospital discharge follow up appointment on 9/27 at 2:30 PM.   Bring photo ID, copy of insurance card, hospital discharge paperwork.  Call to cancel/reschedule if needed.   Contact information: [address] [place] [address] [phone]           Plan Of Care/Follow-up recommendations:  Activity:  as tolerated  Diet:  Regular Tests:  NA Other:  See below Patient is expressing readiness for discharge and there are no grounds for involuntary commitment at this time He plans to stay with family member for a few days and then return to his home Plans to follow up as above  Encouraged to follow up with his PCP to continue to monitor and manage transaminase elevation .See above  Referred to [facility] . Craige Cotta, MD 10/01/2016, 10:42 AM

## 2016-10-01 NOTE — Progress Notes (Signed)
Patient ID: Andrew Snow, male   DOB: 01-24-1980, 36 y.o.   MRN: [id number]  DAR: Pt. Denies SI/HI and A/V Hallucinations. He reports sleep is fair, appetite is fair, energy level is normal, and concentration is good. He rates depression 0/10, hopelessness 0/10, and anxiety 0/10. Patient does report chronic hip pain and is receiving scheduled Gabapentin and reports that it does decrease his pain level. Support and encouragement provided to the patient. Scheduled medications administered to patient per physician's orders. Patient is cooperative at this time however minimal. He reports that he feels ready for discharge. Q15 minute checks are maintained for safety.

## 2016-10-01 NOTE — Progress Notes (Signed)
  [facility] Adult Case Management Discharge Plan :  Will you be returning to the same living situation after discharge:  Yes,  return home At discharge, do you have transportation home?: Yes,  can be given bus if needed Do you have the ability to pay for your medications: No. referred to provider who can assist  Release of information consent forms completed and in the chart;  Patient's signature needed at discharge.  Patient to Follow up at: Follow-up Information    [facility] Follow up on 10/04/2016.   Specialty:  Behavioral Health Why:  Hospital discharge follow up appointment on 9/27 at 2:30 PM.   Bring photo ID, copy of insurance card, hospital discharge paperwork.  Call to cancel/reschedule if needed.   Contact information: [address] [place] [address] [phone]           Next level of care provider has access to [facility] Link:no  Safety Planning and Suicide Prevention discussed: Yes,  reviewd w patient, declined collateral contact  Have you used any form of tobacco in the last 30 days? (Cigarettes, Smokeless Tobacco, Cigars, and/or Pipes): Yes  Has patient been referred to the Quitline?: Patient refused referral  Patient has been referred for addiction treatment: Yes  Sallee Lange, LCSW 10/01/2016, 9:02 AM
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
## 2016-10-01 NOTE — Progress Notes (Signed)
Recreation Therapy Notes  Date: 10/01/16 Time: 0930 Location: 300 Hall Dayroom  Group Topic: Stress Management  Goal Area(s) Addresses:  Patient will verbalize importance of using healthy stress management.  Patient will identify positive emotions associated with healthy stress management.   Intervention: Stress Management  Activity :  Guided Imagery.  LRT introduced the stress management technique of guided imagery.  LRT read Snow script to allow patients to take Snow journey to their peaceful place.  Patients were to follow along as LRT read script to engage in the meditation.  Education:  Stress Management, Discharge Planning.   Education Outcome: Acknowledges edcuation/In group clarification offered/Needs additional education  Clinical Observations/Feedback: Pt did not attend group.   Andrew Snow, LRT/CTRS         Andrew Snow 10/01/2016 12:30 PM 

## 2016-10-02 LAB — HEPATITIS B SURFACE ANTIGEN

## 2016-10-02 LAB — HEPATITIS B SURFACE AG, CONFIRM: HBSAG CONFIRMATION: POSITIVE — AB

## 2016-10-12 ENCOUNTER — Ambulatory Visit: Payer: Self-pay | Admitting: Family Medicine

## 2016-10-22 ENCOUNTER — Ambulatory Visit: Payer: Self-pay | Admitting: Family Medicine

## 2016-10-26 ENCOUNTER — Ambulatory Visit (INDEPENDENT_AMBULATORY_CARE_PROVIDER_SITE_OTHER): Payer: Self-pay | Admitting: Family Medicine

## 2016-10-26 ENCOUNTER — Encounter: Payer: Self-pay | Admitting: Family Medicine

## 2016-10-26 VITALS — BP 124/80 | Temp 98.6°F | Resp 16 | Ht 72.0 in | Wt 252.0 lb

## 2016-10-26 DIAGNOSIS — K7689 Other specified diseases of liver: Secondary | ICD-10-CM

## 2016-10-26 DIAGNOSIS — M25551 Pain in right hip: Secondary | ICD-10-CM

## 2016-10-26 DIAGNOSIS — F3341 Major depressive disorder, recurrent, in partial remission: Secondary | ICD-10-CM

## 2016-10-26 DIAGNOSIS — R945 Abnormal results of liver function studies: Secondary | ICD-10-CM

## 2016-10-26 DIAGNOSIS — F5104 Psychophysiologic insomnia: Secondary | ICD-10-CM

## 2016-10-26 LAB — COMPLETE METABOLIC PANEL WITH GFR
AG RATIO: 1.3 (calc) (ref 1.0–2.5)
ALT: 44 U/L (ref 9–46)
AST: 33 U/L (ref 10–40)
Albumin: 3.7 g/dL (ref 3.6–5.1)
Alkaline phosphatase (APISO): 87 U/L (ref 40–115)
BILIRUBIN TOTAL: 0.4 mg/dL (ref 0.2–1.2)
BUN: 10 mg/dL (ref 7–25)
CHLORIDE: 108 mmol/L (ref 98–110)
CO2: 27 mmol/L (ref 20–32)
Calcium: 9 mg/dL (ref 8.6–10.3)
Creat: 0.74 mg/dL (ref 0.60–1.35)
GFR, Est African American: 137 mL/min/{1.73_m2} (ref >=60)
GFR, Est Non African American: 118 mL/min/{1.73_m2} (ref >=60)
Globulin: 2.9 g/dL (calc) (ref 1.9–3.7)
Glucose, Bld: 95 mg/dL (ref 65–99)
POTASSIUM: 4.2 mmol/L (ref 3.5–5.3)
Sodium: 140 mmol/L (ref 135–146)
Total Protein: 6.6 g/dL (ref 6.1–8.1)

## 2016-10-26 LAB — CBC WITH DIFFERENTIAL/PLATELET
BASOS ABS: 53 {cells}/uL (ref 0–200)
BASOS PCT: 0.6 %
EOS PCT: 3.1 %
Eosinophils Absolute: 276 cells/uL (ref 15–500)
HCT: 40.7 % (ref 38.5–50.0)
Hemoglobin: 13.4 g/dL (ref 13.2–17.1)
Lymphs Abs: 3889 cells/uL (ref 850–3900)
MCH: 28.5 pg (ref 27.0–33.0)
MCHC: 32.9 g/dL (ref 32.0–36.0)
MCV: 86.6 fL (ref 80.0–100.0)
MONOS PCT: 8.5 %
MPV: 11.4 fL (ref 7.5–12.5)
NEUTROS ABS: 3925 {cells}/uL (ref 1500–7800)
Neutrophils Relative %: 44.1 %
Platelets: 248 10*3/uL (ref 140–400)
RBC: 4.7 10*6/uL (ref 4.20–5.80)
RDW: 14.9 % (ref 11.0–15.0)
Total Lymphocyte: 43.7 %
WBC mixed population: 757 cells/uL (ref 200–950)
WBC: 8.9 10*3/uL (ref 3.8–10.8)

## 2016-10-26 MED ORDER — GABAPENTIN 300 MG PO CAPS: 300.0000 mg | ORAL_CAPSULE | Freq: Three times a day (TID) | ORAL | 2 refills | Status: DC

## 2016-10-26 MED ORDER — NICOTINE POLACRILEX 2 MG MT GUM: 2.0000 mg | CHEWING_GUM | OROMUCOSAL | 0 refills | Status: DC | PRN

## 2016-10-26 MED ORDER — NICOTINE POLACRILEX 2 MG MT GUM: 2.0000 mg | CHEWING_GUM | OROMUCOSAL | 3 refills | Status: DC | PRN

## 2016-10-26 MED ORDER — GABAPENTIN 300 MG PO CAPS: 300.0000 mg | ORAL_CAPSULE | Freq: Three times a day (TID) | ORAL | 0 refills | Status: DC

## 2016-10-26 MED ORDER — ALBUTEROL SULFATE HFA 108 (90 BASE) MCG/ACT IN AERS: 2.0000 | INHALATION_SPRAY | RESPIRATORY_TRACT | 1 refills | Status: DC | PRN

## 2016-10-26 MED ORDER — SERTRALINE HCL 25 MG PO TABS: 75.0000 mg | ORAL_TABLET | Freq: Every day | ORAL | 0 refills | Status: DC

## 2016-10-26 MED ORDER — TRAZODONE HCL 100 MG PO TABS: 100.0000 mg | ORAL_TABLET | Freq: Every evening | ORAL | 2 refills | Status: DC | PRN

## 2016-10-26 MED FILL — ?SERTRALINE HCL 25 MG TABLE: 25 | 30 days supply | Qty: 90 | Fill #0

## 2016-10-26 MED FILL — !VENTOLIN HFA INHALER: 108 (90 BAS | 25 days supply | Qty: 18 | Fill #0

## 2016-10-26 MED FILL — GABAPENTIN 300 MG CAPSULE: 300 | 30 days supply | Qty: 90 | Fill #0

## 2016-10-26 MED FILL — traZODone HCL 100 MG TABS: 100 | 30 days supply | Qty: 30 | Fill #0

## 2016-10-26 NOTE — Patient Instructions (Addendum)
[facility]   Avoid acetaminophen or alcohol as your liver enzymes were recently elevated. I will update regarding your most recent lab results.    Major Depressive Disorder, Adult Major depressive disorder (MDD) is a mental health condition. MDD often makes you feel sad, hopeless, or helpless. MDD can also cause symptoms in your body. MDD can affect your:  Work.  School.  Relationships.  Other normal activities.  MDD can range from mild to very bad. It may occur once (single episode MDD). It can also occur many times (recurrent MDD). The main symptoms of MDD often include:  Feeling sad, depressed, or irritable most of the time.  Loss of interest.  MDD symptoms also include:  Sleeping too much or too little.  Eating too much or too little.  A change in your weight.  Feeling tired (fatigue) or having low energy.  Feeling worthless.  Feeling guilty.  Trouble making decisions.  Trouble thinking clearly.  Thoughts of suicide or harming others.  Feeling weak.  Feeling agitated.  Keeping yourself from being around other people (isolation).  Follow these instructions at home: Activity  Do these things as told by your doctor: ? Go back to your normal activities. ? Exercise regularly. ? Spend time outdoors. Alcohol  Talk with your doctor about how alcohol can affect your antidepressant medicines.  Do not drink alcohol. Or, limit how much alcohol you drink. ? This means no more than 1 drink a day for nonpregnant women and 2 drinks a day for men. One drink equals one of these:  12 oz of beer.  5 oz of wine.  1 oz of hard liquor. General instructions  Take over-the-counter and prescription medicines only as told by your doctor.  Eat a healthy diet.  Get plenty of sleep.  Find activities that you enjoy. Make time to do them.  Think about joining a support group. Your doctor may be able to suggest a group  for you.  Keep all follow-up visits as told by your doctor. This is important. Where to find more information:  The [employer] on Mental Illness: ? www.nami.org  U.S. [employer] of Mental Health: ? http://www.maynard.net/www.nimh.nih.gov  National Suicide Prevention Lifeline: ? (352)198-32041-502-815-0402. This is free, 24-hour help. Contact a doctor if:  Your symptoms get worse.  You have new symptoms. Get help right away if:  You self-harm.  You see, hear, taste, smell, or feel things that are not present (hallucinate). If you ever feel like you may hurt yourself or others, or have thoughts about taking your own life, get help right away. You can go to your nearest emergency department or call:  Your local emergency services (911 in the [place]  A suicide crisis helpline, such as the National Suicide Prevention Lifeline: ? 845-035-00691-502-815-0402. This is open 24 hours a day.  This information is not intended to replace advice given to you by your health care provider. Make sure you discuss any questions you have with your health care provider. Document Released: 12/06/2014 Document Revised: 09/11/2015 Document Reviewed: 09/11/2015 Elsevier Interactive Patient Education  2017 [employer]
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
## 2017-04-26 ENCOUNTER — Ambulatory Visit: Payer: Self-pay | Admitting: Family Medicine

## 2017-05-08 ENCOUNTER — Ambulatory Visit: Payer: Self-pay | Admitting: Family Medicine

## 2017-06-30 ENCOUNTER — Emergency Department
Admission: EM | Admit: 2017-06-30 | Discharge: 2017-06-30 | Disposition: A | Discharge status: Home / Self Care | Payer: Self-pay | Attending: Emergency Medicine | Admitting: Emergency Medicine

## 2017-06-30 ENCOUNTER — Other Ambulatory Visit: Payer: Self-pay

## 2017-06-30 ENCOUNTER — Emergency Department: Payer: Self-pay

## 2017-06-30 ENCOUNTER — Encounter: Payer: Self-pay | Admitting: Emergency Medicine

## 2017-06-30 DIAGNOSIS — Y9389 Activity, other specified: Secondary | ICD-10-CM | POA: Insufficient documentation

## 2017-06-30 DIAGNOSIS — S032XXA Dislocation of tooth, initial encounter: Secondary | ICD-10-CM

## 2017-06-30 DIAGNOSIS — S0511XA Contusion of eyeball and orbital tissues, right eye, initial encounter: Secondary | ICD-10-CM

## 2017-06-30 DIAGNOSIS — K08119 Complete loss of teeth due to trauma, unspecified class: Secondary | ICD-10-CM | POA: Insufficient documentation

## 2017-06-30 DIAGNOSIS — S0011XA Contusion of right eyelid and periocular area, initial encounter: Secondary | ICD-10-CM | POA: Insufficient documentation

## 2017-06-30 DIAGNOSIS — Y999 Unspecified external cause status: Secondary | ICD-10-CM | POA: Insufficient documentation

## 2017-06-30 DIAGNOSIS — Y929 Unspecified place or not applicable: Secondary | ICD-10-CM | POA: Insufficient documentation

## 2017-06-30 DIAGNOSIS — H1131 Conjunctival hemorrhage, right eye: Secondary | ICD-10-CM | POA: Insufficient documentation

## 2017-06-30 DIAGNOSIS — S0501XA Injury of conjunctiva and corneal abrasion without foreign body, right eye, initial encounter: Secondary | ICD-10-CM

## 2017-06-30 DIAGNOSIS — S0101XA Laceration without foreign body of scalp, initial encounter: Secondary | ICD-10-CM | POA: Insufficient documentation

## 2017-06-30 MED ORDER — FLUORESCEIN SODIUM 1 MG OP STRP
ORAL_STRIP | OPHTHALMIC | Status: AC
  Filled 2017-06-30: qty 1

## 2017-06-30 MED ORDER — LIDOCAINE HCL (PF) 1 % IJ SOLN
INTRAMUSCULAR | Status: AC
  Filled 2017-06-30: qty 5

## 2017-06-30 MED ORDER — ERYTHROMYCIN 5 MG/GM OP OINT: 1.0000 "application " | TOPICAL_OINTMENT | Freq: Three times a day (TID) | OPHTHALMIC | 0 refills | Status: DC

## 2017-06-30 MED ORDER — KETOROLAC TROMETHAMINE 0.5 % OP SOLN: 1.0000 [drp] | Freq: Three times a day (TID) | OPHTHALMIC | 0 refills | Status: AC

## 2017-06-30 MED ORDER — TETRACAINE HCL 0.5 % OP SOLN
OPHTHALMIC | Status: AC
  Filled 2017-06-30: qty 4
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
## 2017-06-30 NOTE — ED Notes (Signed)
AAOx3.  Skin warm and dry.  NAD 

## 2017-07-08 ENCOUNTER — Encounter: Payer: Self-pay | Admitting: Emergency Medicine

## 2017-07-08 ENCOUNTER — Emergency Department
Admission: EM | Admit: 2017-07-08 | Discharge: 2017-07-08 | Disposition: A | Discharge status: Home / Self Care | Payer: Self-pay | Attending: Emergency Medicine | Admitting: Emergency Medicine

## 2017-07-08 DIAGNOSIS — S0101XD Laceration without foreign body of scalp, subsequent encounter: Secondary | ICD-10-CM | POA: Insufficient documentation

## 2017-07-08 DIAGNOSIS — F329 Major depressive disorder, single episode, unspecified: Secondary | ICD-10-CM | POA: Insufficient documentation

## 2017-07-08 DIAGNOSIS — X58XXXA Exposure to other specified factors, initial encounter: Secondary | ICD-10-CM | POA: Insufficient documentation

## 2017-07-08 DIAGNOSIS — Z4802 Encounter for removal of sutures: Secondary | ICD-10-CM

## 2017-07-08 DIAGNOSIS — F17210 Nicotine dependence, cigarettes, uncomplicated: Secondary | ICD-10-CM | POA: Insufficient documentation

## 2017-07-08 NOTE — ED Notes (Signed)
3 staples removed from occipital scalp; no redness/swelling/drainage noted; edges well approximated; pt tolerated well and denies any c/o
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
## 2017-07-08 NOTE — Discharge Instructions (Addendum)
Follow-up with your regular doctor as needed.  Return to emergency department if there is any sign of infection.

## 2017-07-08 NOTE — ED Triage Notes (Signed)
Pt here to have 3 staples removed from back of head.

## 2017-08-12 ENCOUNTER — Encounter: Payer: Self-pay | Admitting: Emergency Medicine

## 2017-08-12 ENCOUNTER — Other Ambulatory Visit: Payer: Self-pay

## 2017-08-12 ENCOUNTER — Emergency Department: Payer: Self-pay

## 2017-08-12 ENCOUNTER — Emergency Department
Admission: EM | Admit: 2017-08-12 | Discharge: 2017-08-13 | Disposition: A | Discharge status: Home / Self Care | Payer: Self-pay | Attending: Student in an Organized Health Care Education/Training Program | Admitting: Student in an Organized Health Care Education/Training Program

## 2017-08-12 DIAGNOSIS — Y999 Unspecified external cause status: Secondary | ICD-10-CM | POA: Insufficient documentation

## 2017-08-12 DIAGNOSIS — W01198A Fall on same level from slipping, tripping and stumbling with subsequent striking against other object, initial encounter: Secondary | ICD-10-CM | POA: Insufficient documentation

## 2017-08-12 DIAGNOSIS — Y9301 Activity, walking, marching and hiking: Secondary | ICD-10-CM | POA: Insufficient documentation

## 2017-08-12 DIAGNOSIS — S62336A Displaced fracture of neck of fifth metacarpal bone, right hand, initial encounter for closed fracture: Secondary | ICD-10-CM

## 2017-08-12 DIAGNOSIS — Y929 Unspecified place or not applicable: Secondary | ICD-10-CM | POA: Insufficient documentation

## 2017-08-12 DIAGNOSIS — F17210 Nicotine dependence, cigarettes, uncomplicated: Secondary | ICD-10-CM | POA: Insufficient documentation

## 2017-08-12 MED ORDER — SULFAMETHOXAZOLE-TRIMETHOPRIM 800-160 MG PO TABS: 1.0000 | ORAL_TABLET | Freq: Two times a day (BID) | ORAL | 0 refills | Status: DC

## 2017-08-12 MED ORDER — SULFAMETHOXAZOLE-TRIMETHOPRIM 800-160 MG PO TABS
1.0000 | ORAL_TABLET | Freq: Once | ORAL | Status: AC
  Administered 2017-08-12: 1 via ORAL
  Filled 2017-08-12: qty 1
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
## 2017-08-12 NOTE — ED Notes (Signed)
Pt states he fell on Saturday and hit hand on wooden step. Small abrasion noted states unsure about wooden splinter. Pt also has large amount of swelling and bruising to outer aspect of hand.

## 2017-08-13 NOTE — ED Notes (Signed)
Ulnar Gutter splint applied per ED tech, Sarah.
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
## 2018-10-22 IMAGING — CR DG HAND COMPLETE 3+V*R*
1 series · 3 of 3 positions shown · non-contrast
Comparison: None.

CLINICAL DATA: Pain after mechanical fall on [REDACTED] landing on
right hand. Pain and swelling.

EXAM:
RIGHT HAND - COMPLETE 3+ VIEW

[Series 1: dg hand complete right · 0.14mm/px · 3 of 3 slices shown]
[im 1/3]
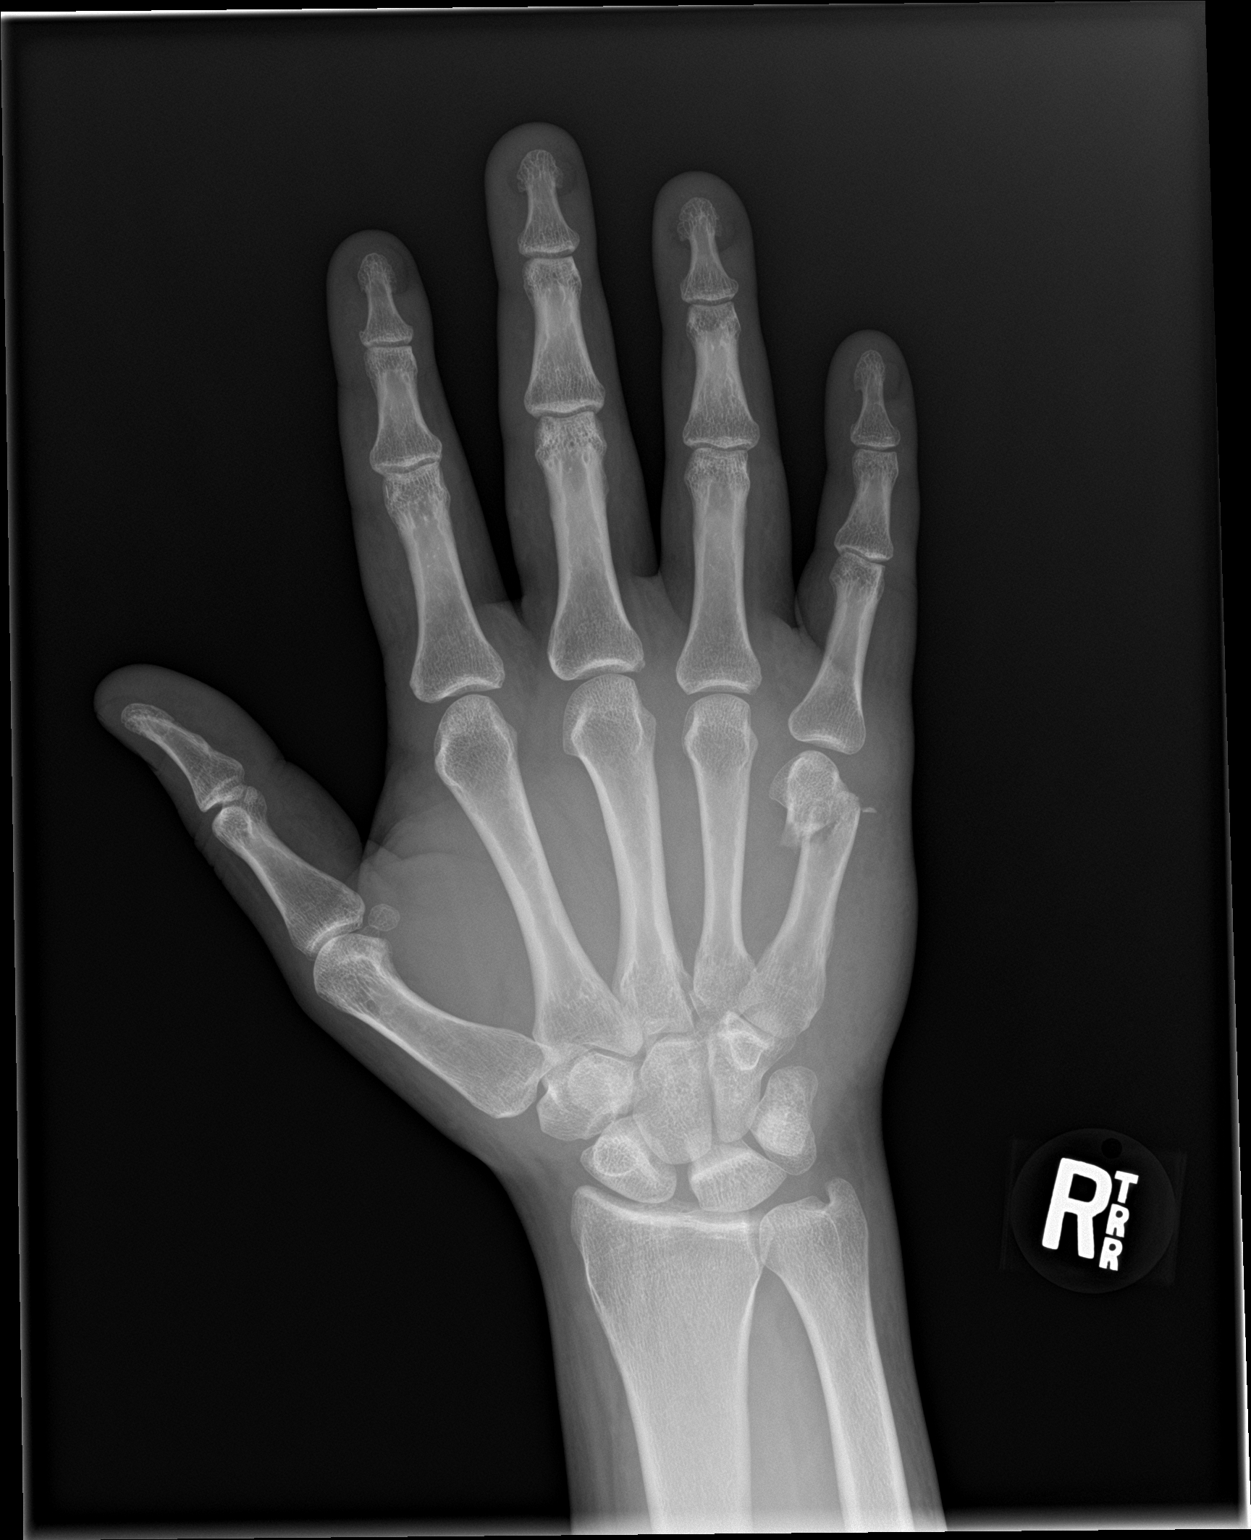
[im 2/3]
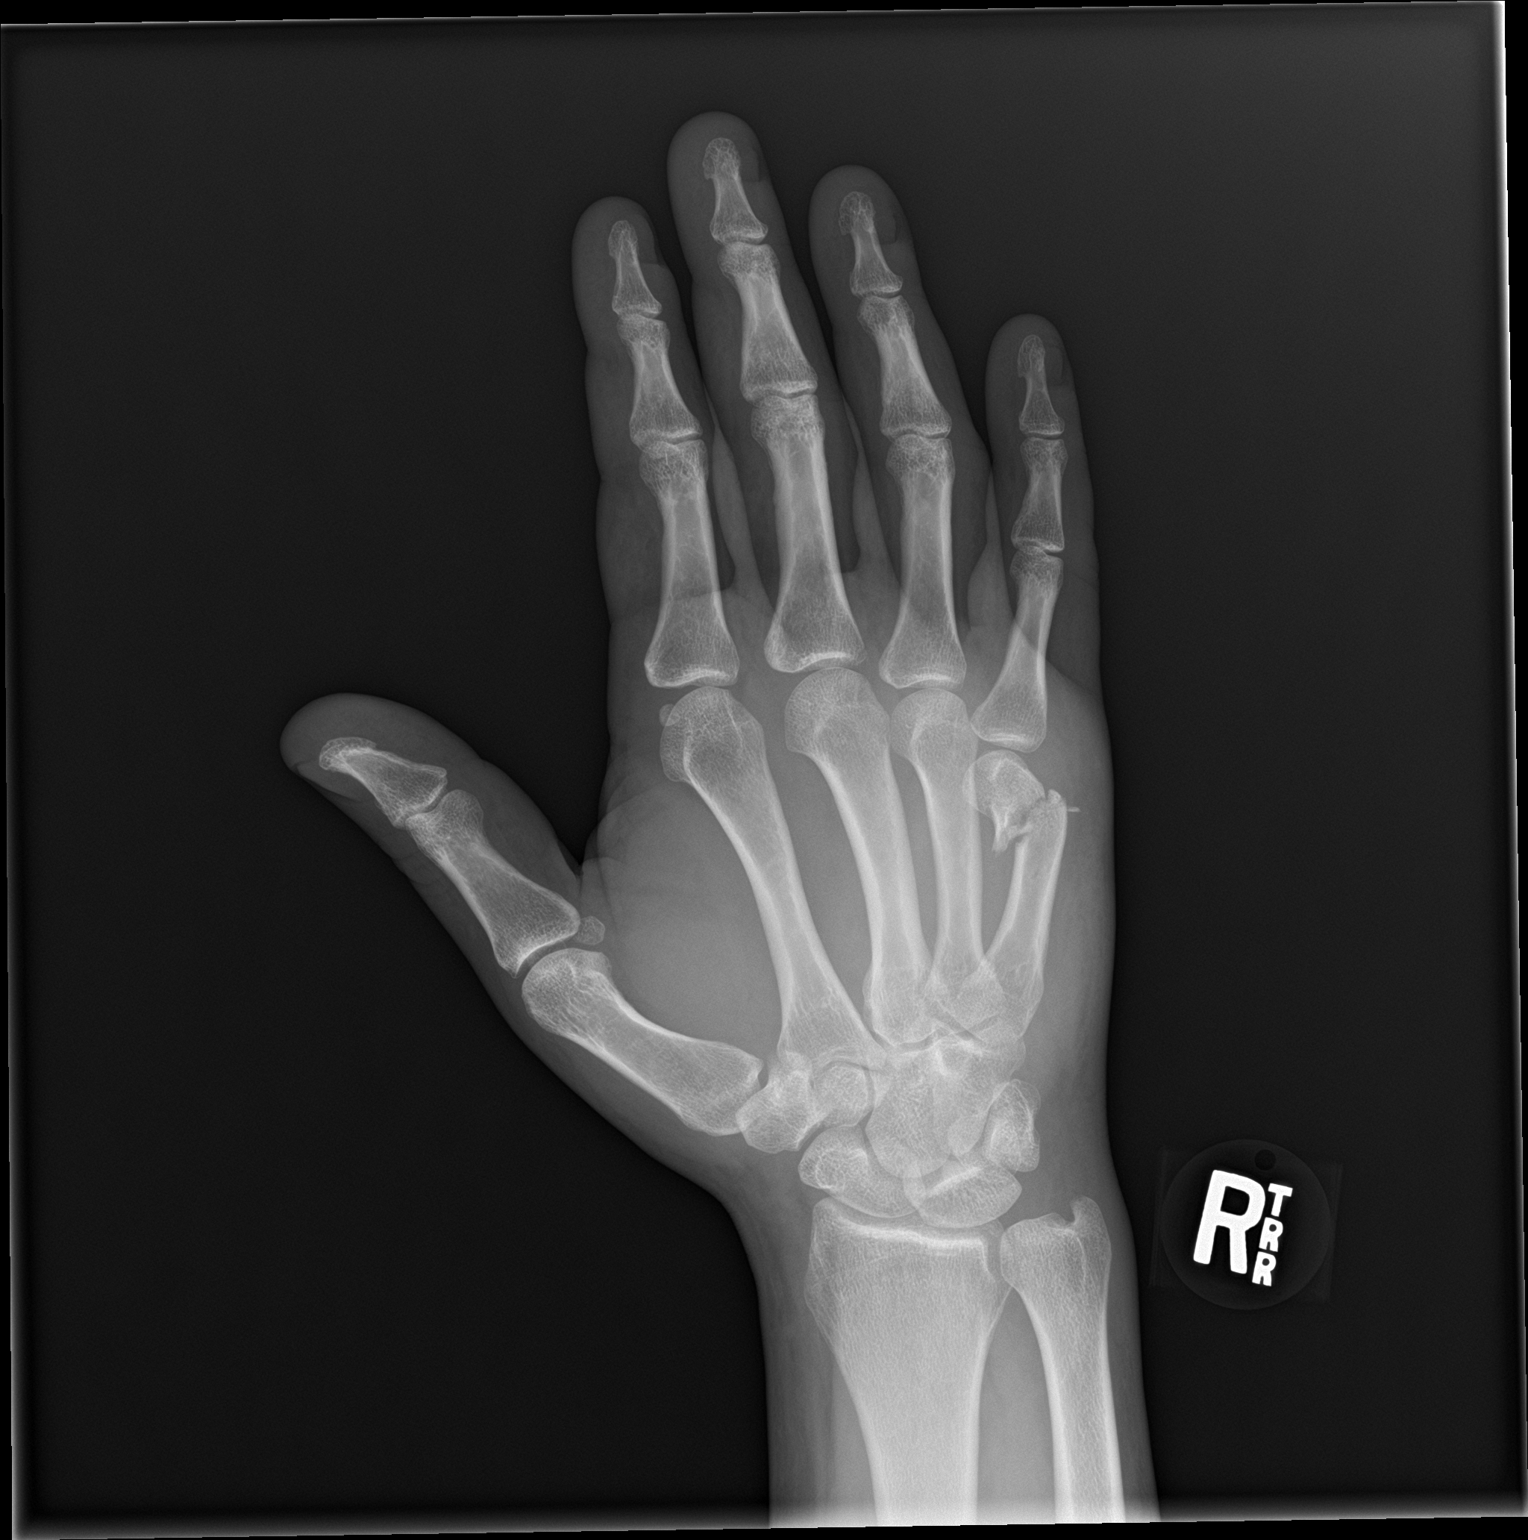
[im 3/3]
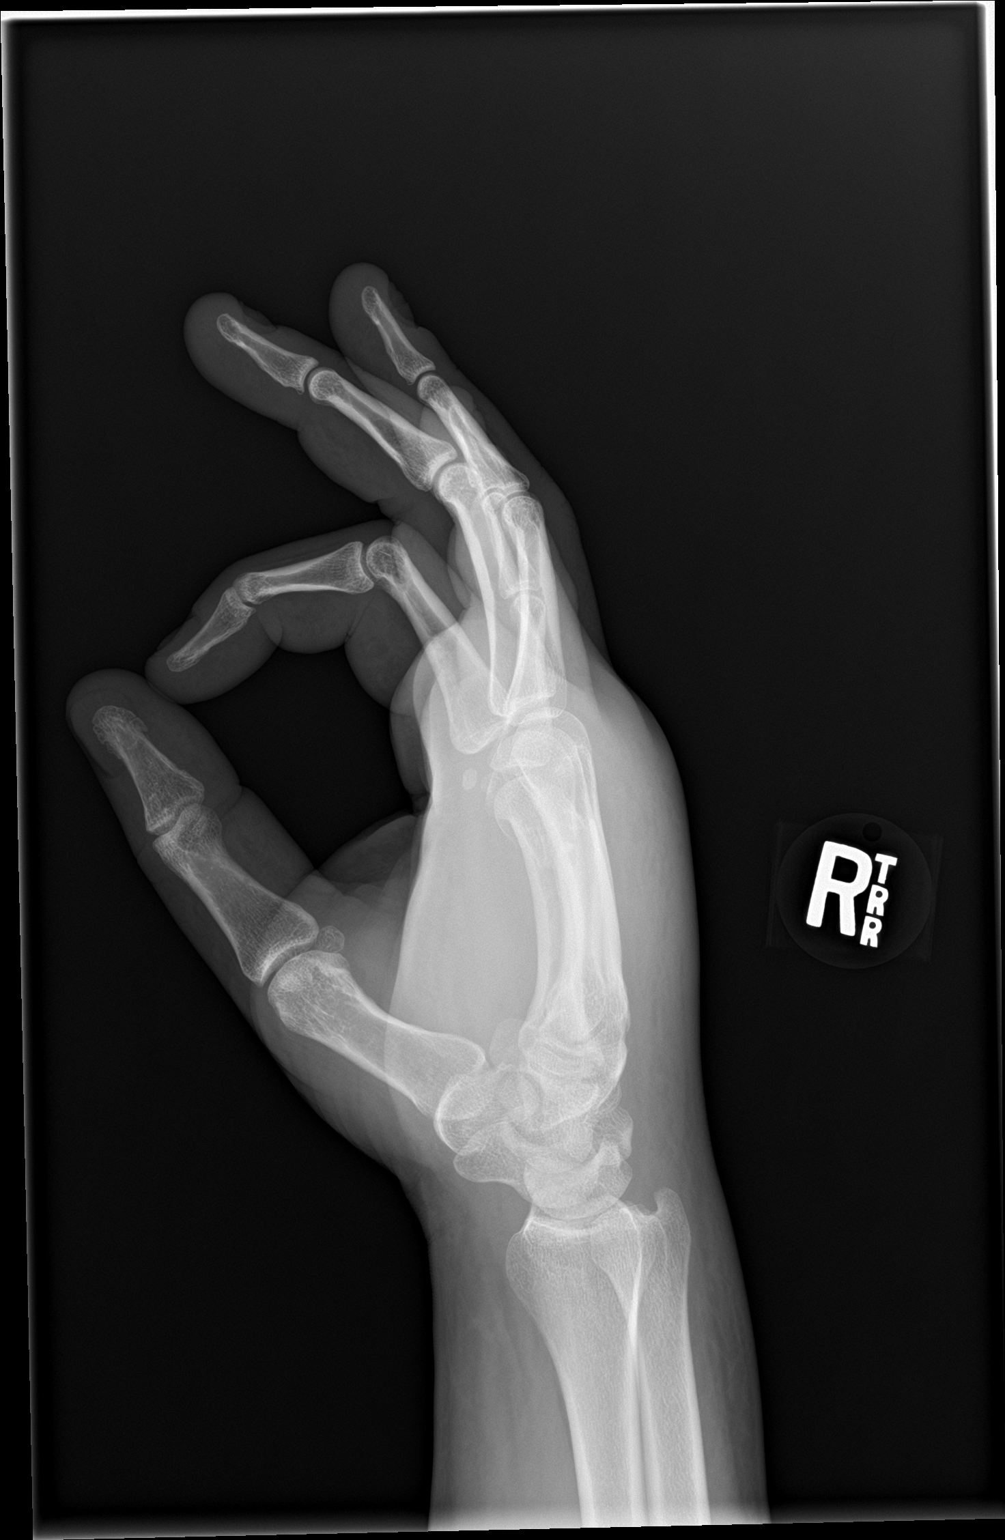

[3 of 3 positions shown; findings below may reference images not displayed]

FINDINGS: An acute, closed, predominantly oblique but slightly comminuted
fracture involving the neck of the right fifth metacarpal is
identified. There is slight proximal and [DATE] shaft width radial
displacement of the metacarpal head. No joint dislocation is
identified. Remainder of the study is unremarkable. There is soft
tissue swelling adjacent to the fracture along the ulnar aspect of
the hand.
IMPRESSION: Acute slightly comminuted but predominantly oblique fracture
involving the fifth metacarpal neck with slight proximal migration
of the metacarpal head and radial displacement [DATE] shaft width
noted.

## 2020-11-25 ENCOUNTER — Emergency Department
Admission: EM | Admit: 2020-11-25 | Discharge: 2020-11-25 | Disposition: A | Discharge status: Home / Self Care | Payer: Self-pay | Attending: Emergency Medicine | Admitting: Emergency Medicine

## 2020-11-25 ENCOUNTER — Emergency Department: Payer: Self-pay

## 2020-11-25 ENCOUNTER — Other Ambulatory Visit: Payer: Self-pay

## 2020-11-25 ENCOUNTER — Encounter: Payer: Self-pay | Admitting: Emergency Medicine

## 2020-11-25 DIAGNOSIS — F17210 Nicotine dependence, cigarettes, uncomplicated: Secondary | ICD-10-CM | POA: Insufficient documentation

## 2020-11-25 DIAGNOSIS — R1011 Right upper quadrant pain: Secondary | ICD-10-CM

## 2020-11-25 DIAGNOSIS — K2900 Acute gastritis without bleeding: Secondary | ICD-10-CM

## 2020-11-25 DIAGNOSIS — K859 Acute pancreatitis without necrosis or infection, unspecified: Secondary | ICD-10-CM

## 2020-11-25 LAB — URINALYSIS, ROUTINE W REFLEX MICROSCOPIC
Bacteria, UA: NONE SEEN
Bilirubin Urine: NEGATIVE
Glucose, UA: NEGATIVE mg/dL
Hgb urine dipstick: NEGATIVE
Ketones, ur: NEGATIVE mg/dL
Nitrite: NEGATIVE
Protein, ur: NEGATIVE mg/dL
Specific Gravity, Urine: 1.016 (ref 1.005–1.030)
Squamous Epithelial / LPF: NONE SEEN (ref 0–5)
pH: 5 (ref 5.0–8.0)

## 2020-11-25 LAB — CBC
HCT: 40.6 % (ref 39.0–52.0)
Hemoglobin: 13.1 g/dL (ref 13.0–17.0)
MCH: 28.1 pg (ref 26.0–34.0)
MCHC: 32.3 g/dL (ref 30.0–36.0)
MCV: 87.1 fL (ref 80.0–100.0)
Platelets: 270 10*3/uL (ref 150–400)
RBC: 4.66 MIL/uL (ref 4.22–5.81)
RDW: 15.6 % — ABNORMAL HIGH (ref 11.5–15.5)
WBC: 9.1 10*3/uL (ref 4.0–10.5)
nRBC: 0 % (ref 0.0–0.2)

## 2020-11-25 LAB — COMPREHENSIVE METABOLIC PANEL
ALT: 16 U/L (ref 0–44)
AST: 19 U/L (ref 15–41)
Albumin: 3.7 g/dL (ref 3.5–5.0)
Alkaline Phosphatase: 55 U/L (ref 38–126)
Anion gap: 7 (ref 5–15)
BUN: 16 mg/dL (ref 6–20)
CO2: 25 mmol/L (ref 22–32)
Calcium: 8.5 mg/dL — ABNORMAL LOW (ref 8.9–10.3)
Chloride: 109 mmol/L (ref 98–111)
Creatinine, Ser: 0.69 mg/dL (ref 0.61–1.24)
GFR, Estimated: >60 mL/min (ref >60)
Glucose, Bld: 107 mg/dL — ABNORMAL HIGH (ref 70–99)
Potassium: 4 mmol/L (ref 3.5–5.1)
Sodium: 141 mmol/L (ref 135–145)
Total Bilirubin: 0.6 mg/dL (ref 0.3–1.2)
Total Protein: 7.1 g/dL (ref 6.5–8.1)

## 2020-11-25 LAB — TRIGLYCERIDES: Triglycerides: 44 mg/dL (ref <150)

## 2020-11-25 LAB — TROPONIN I (HIGH SENSITIVITY): Troponin I (High Sensitivity): 3 ng/L (ref <18)

## 2020-11-25 LAB — LIPASE, BLOOD: Lipase: 112 U/L — ABNORMAL HIGH (ref 11–51)

## 2020-11-25 MED ORDER — PANTOPRAZOLE SODIUM 20 MG PO TBEC: 20.0000 mg | DELAYED_RELEASE_TABLET | Freq: Every day | ORAL | 0 refills | Status: AC

## 2020-11-25 MED ORDER — LIDOCAINE 5 % EX PTCH
1.0000 | MEDICATED_PATCH | CUTANEOUS | Status: DC
  Administered 2020-11-25: 1 via TRANSDERMAL
  Filled 2020-11-25: qty 1

## 2020-11-25 MED ORDER — OXYCODONE HCL 5 MG PO TABS: 5.0000 mg | ORAL_TABLET | Freq: Four times a day (QID) | ORAL | 0 refills | Status: AC | PRN

## 2020-11-25 MED ORDER — SUCRALFATE 1 G PO TABS: 1.0000 g | ORAL_TABLET | Freq: Three times a day (TID) | ORAL | 0 refills | Status: AC

## 2020-11-25 MED ORDER — ACETAMINOPHEN 500 MG PO TABS
1000.0000 mg | ORAL_TABLET | Freq: Once | ORAL | Status: AC
  Administered 2020-11-25: 1000 mg via ORAL
  Filled 2020-11-25: qty 2

## 2020-11-25 NOTE — Discharge Instructions (Addendum)
This could be related to inflammation of your stomach called gastritis or potentially inflammation of your pancreas called pancreatitis.  Take Tylenol 1 g every 8 hours to help with pain.  Take the Protonix and the Carafate to help line the stomach in case this is acid reflux.  Will prescribe a short number of oxycodone for breakthrough pain.  Do not drive or work while on these.  US showed: You need to f/u with GI- call to make appointment.  Mild fatty infiltration of the liver.  No acute findings.  Start off with clear diet or very light diet such as broth, jello etc.  When restarting diet, eat small, low-fat meals and gradually advance over 3 to 6 days as tolerated  F/u triglyceride test in mychart.    Take oxycodone as prescribed. Do not drink alcohol, drive or participate in any other potentially dangerous activities while taking this medication as it may make you sleepy. Do not take this medication with any other sedating medications, either prescription or over-the-counter. If you were prescribed Percocet or Vicodin, do not take these with acetaminophen (Tylenol) as it is already contained within these medications.  This medication is an opiate (or narcotic) pain medication and can be habit forming. Use it as little as possible to achieve adequate pain control. Do not use or use it with extreme caution if you have a history of opiate abuse or dependence. If you are on a pain contract with your primary care doctor or a pain specialist, be sure to let them know you were prescribed this medication today from the Emergency Department. This medication is intended for your use only - do not give any to anyone else and keep it in a secure place where nobody else, especially children, have access to it.

## 2020-11-25 NOTE — ED Triage Notes (Signed)
Pt comes into the ED via POV c/o RUQ abd pain that started Wednesday night.  Pt states when he woke up Thursday morning, it hurt with palpation.  Pt denies any N/V/D.  Pt states his appendix is already removed.  Pt ambulatory to triage with even and unlabored respirations at this time.
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
## 2021-07-08 DEATH — deceased

## 2022-02-04 IMAGING — US US ABDOMEN LIMITED
1 series · 15 of 25 positions shown · non-contrast
Comparison: CT abdomen/pelvis 02/11/2016

CLINICAL DATA: Right upper quadrant pain

EXAM:
ULTRASOUND ABDOMEN LIMITED RIGHT UPPER QUADRANT

[Series 1: us abdomen limited ruq · 15 of 35 slices shown]
[im 1/35]
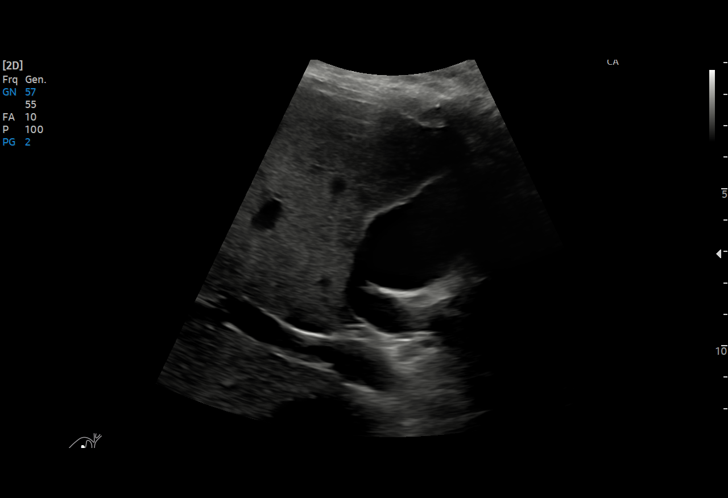
[im 3/35]
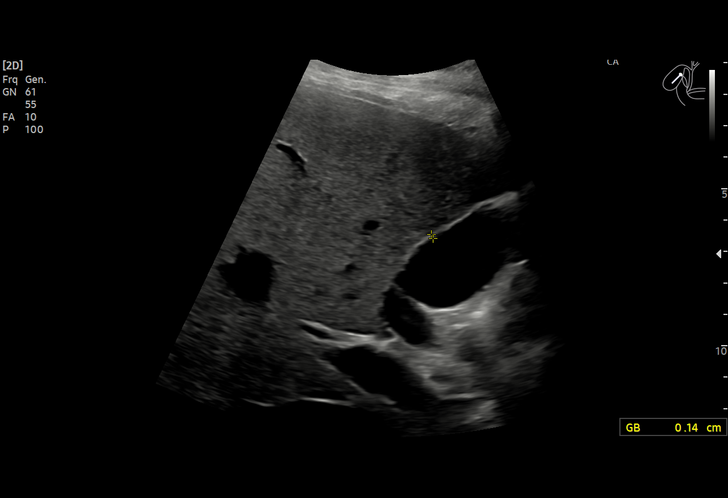
[im 6/35]
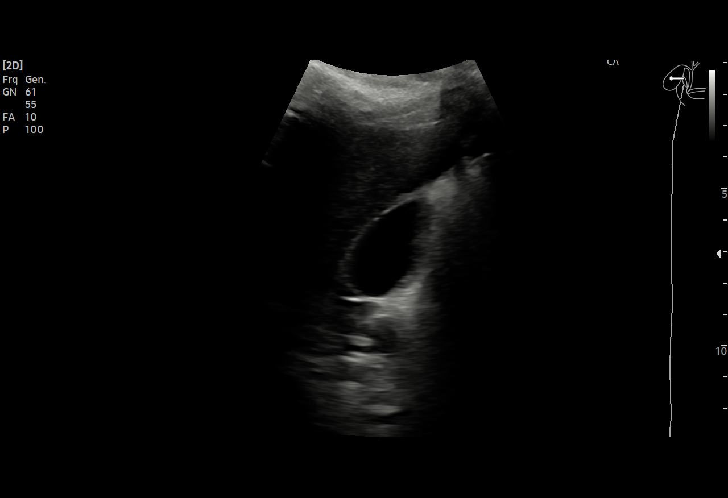
[im 8/35]
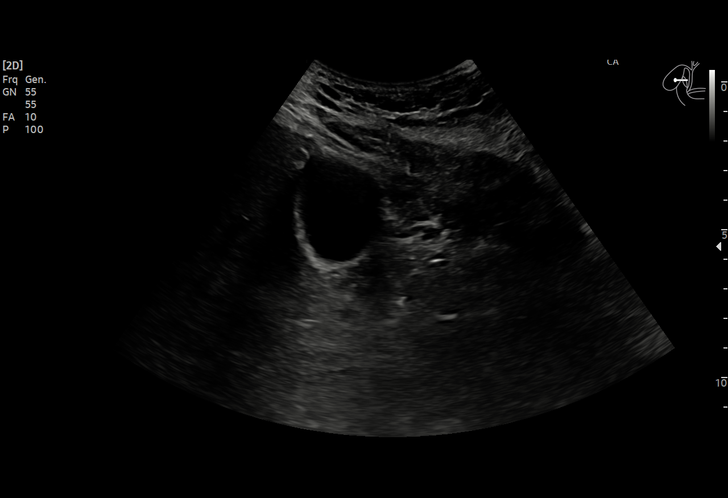
[im 10/35]
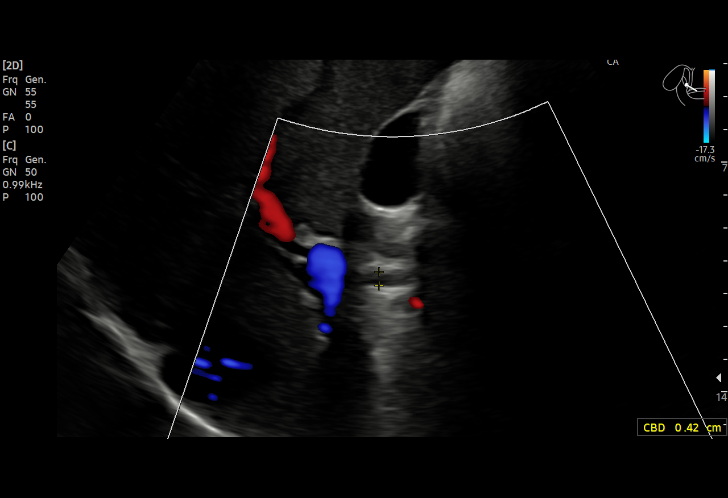
[im 13/35]
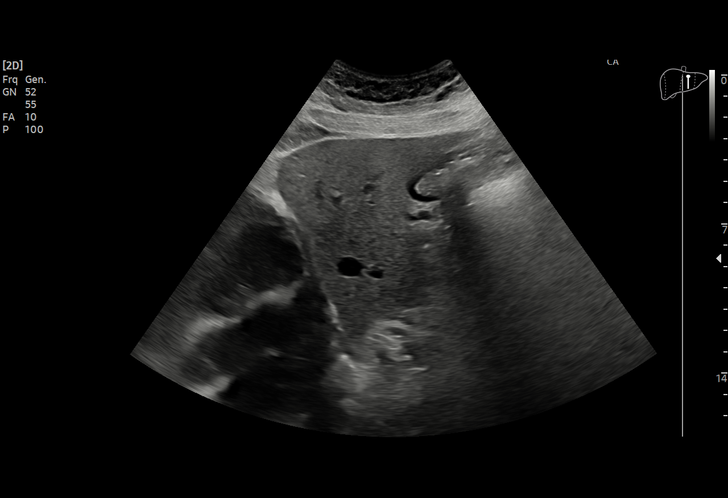
[im 15/35]
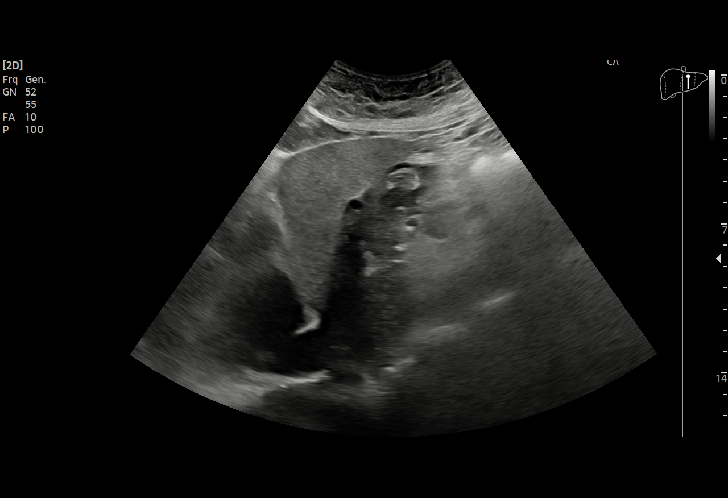
[im 18/35]
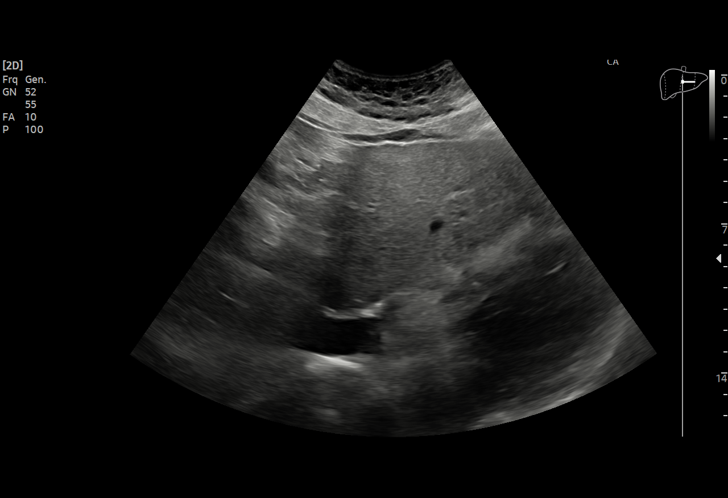
[im 20/35]
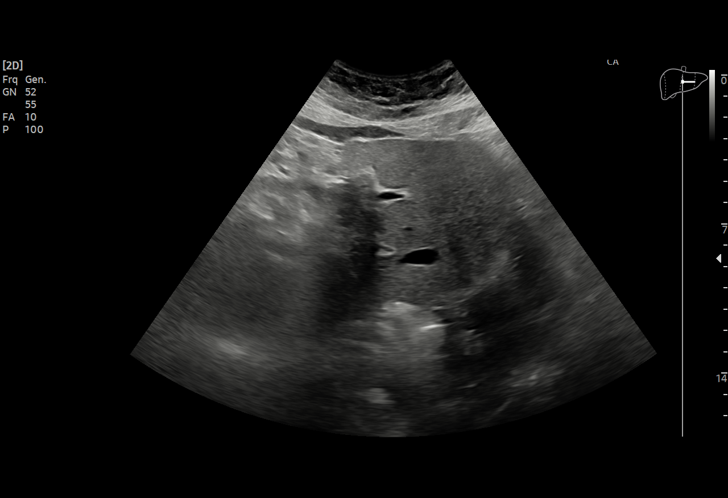
[im 22/35]
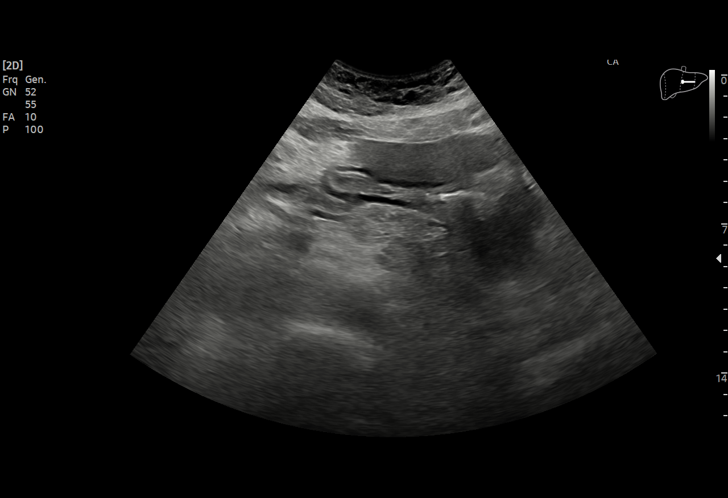
[im 25/35]
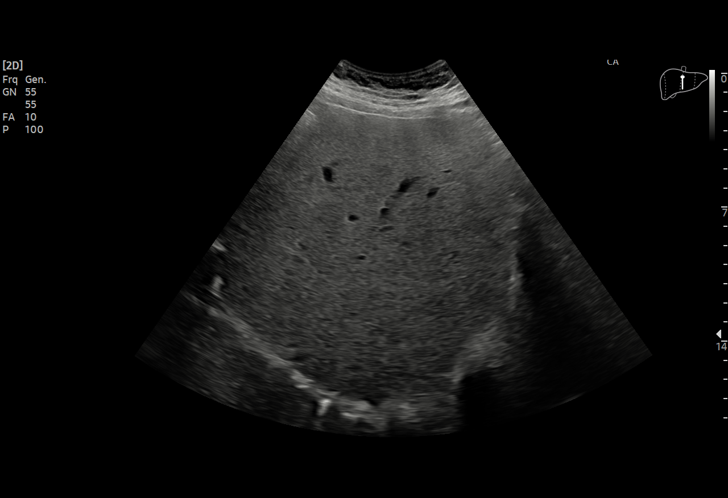
[im 27/35]
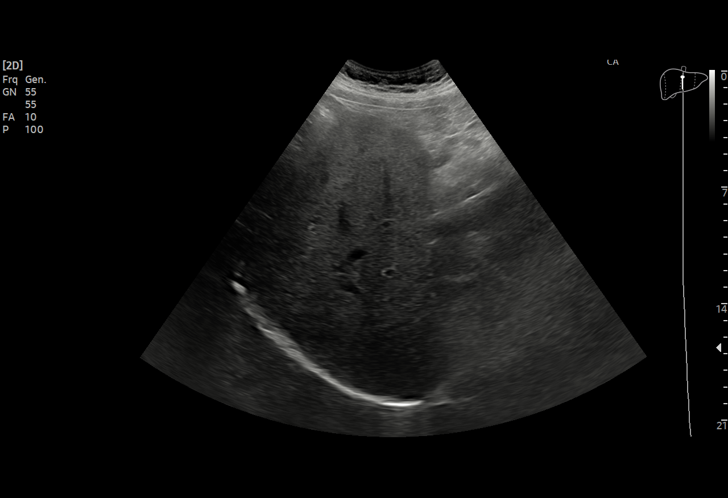
[im 29/35]
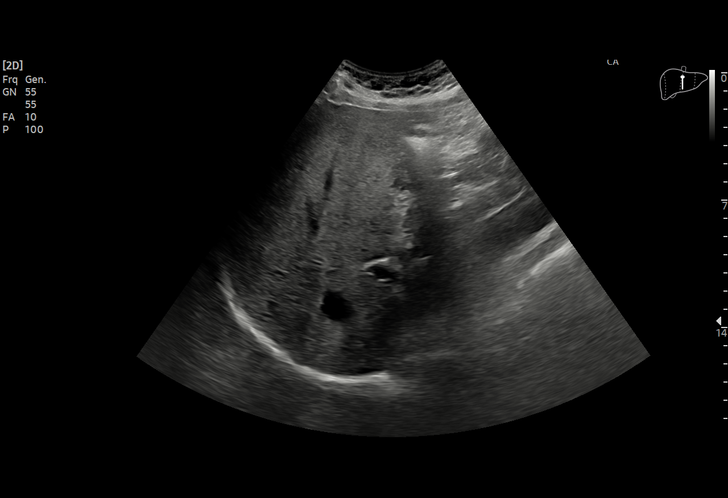
[im 32/35]
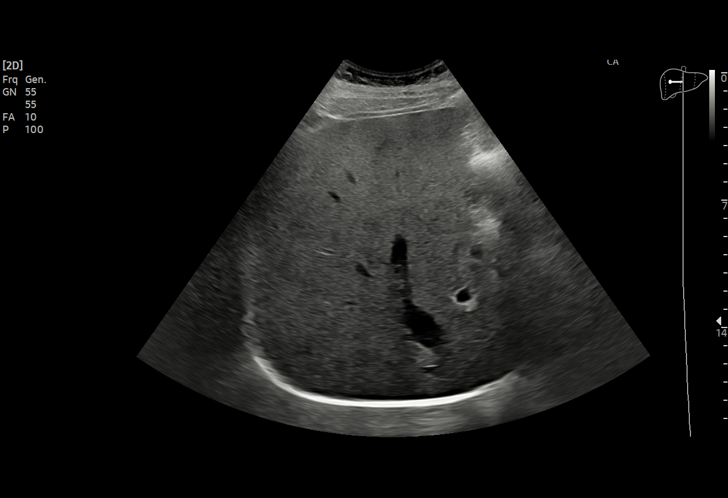
[im 35/35]
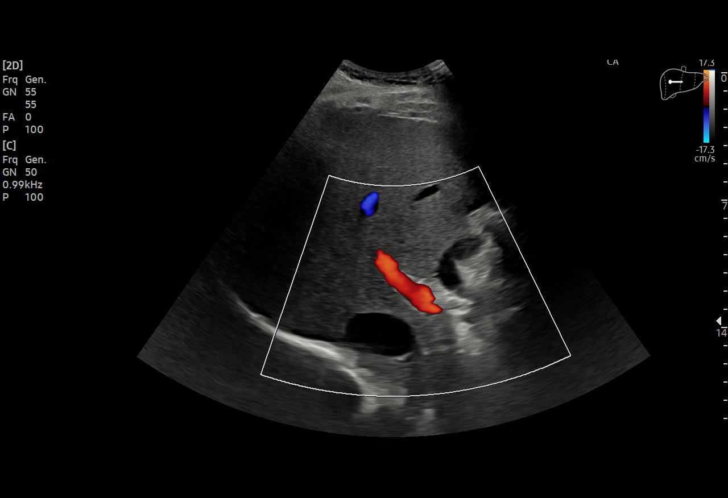

[15 of 25 positions shown; findings below may reference images not displayed]

FINDINGS: Gallbladder:

No gallstones or wall thickening visualized. No sonographic Murphy
sign noted by sonographer.

Common bile duct:

Diameter: 4 mm

Liver:

No focal lesion identified. Parenchymal echogenicity is mildly
increased. Portal vein is patent on color Doppler imaging with
normal direction of blood flow towards the liver.

Other: None.
IMPRESSION: Mild fatty infiltration of the liver.  No acute findings.

## 2022-02-04 IMAGING — CR DG CHEST 2V
1 series · 2 of 2 positions shown · non-contrast
Comparison: CT chest dated February 11, 2016.

CLINICAL DATA: Right upper quadrant pleuritic chest pain.

EXAM:
CHEST - 2 VIEW

[Series 1: w chest pa · 0.14mm/px · 2 of 2 slices shown]
[im 1/2]
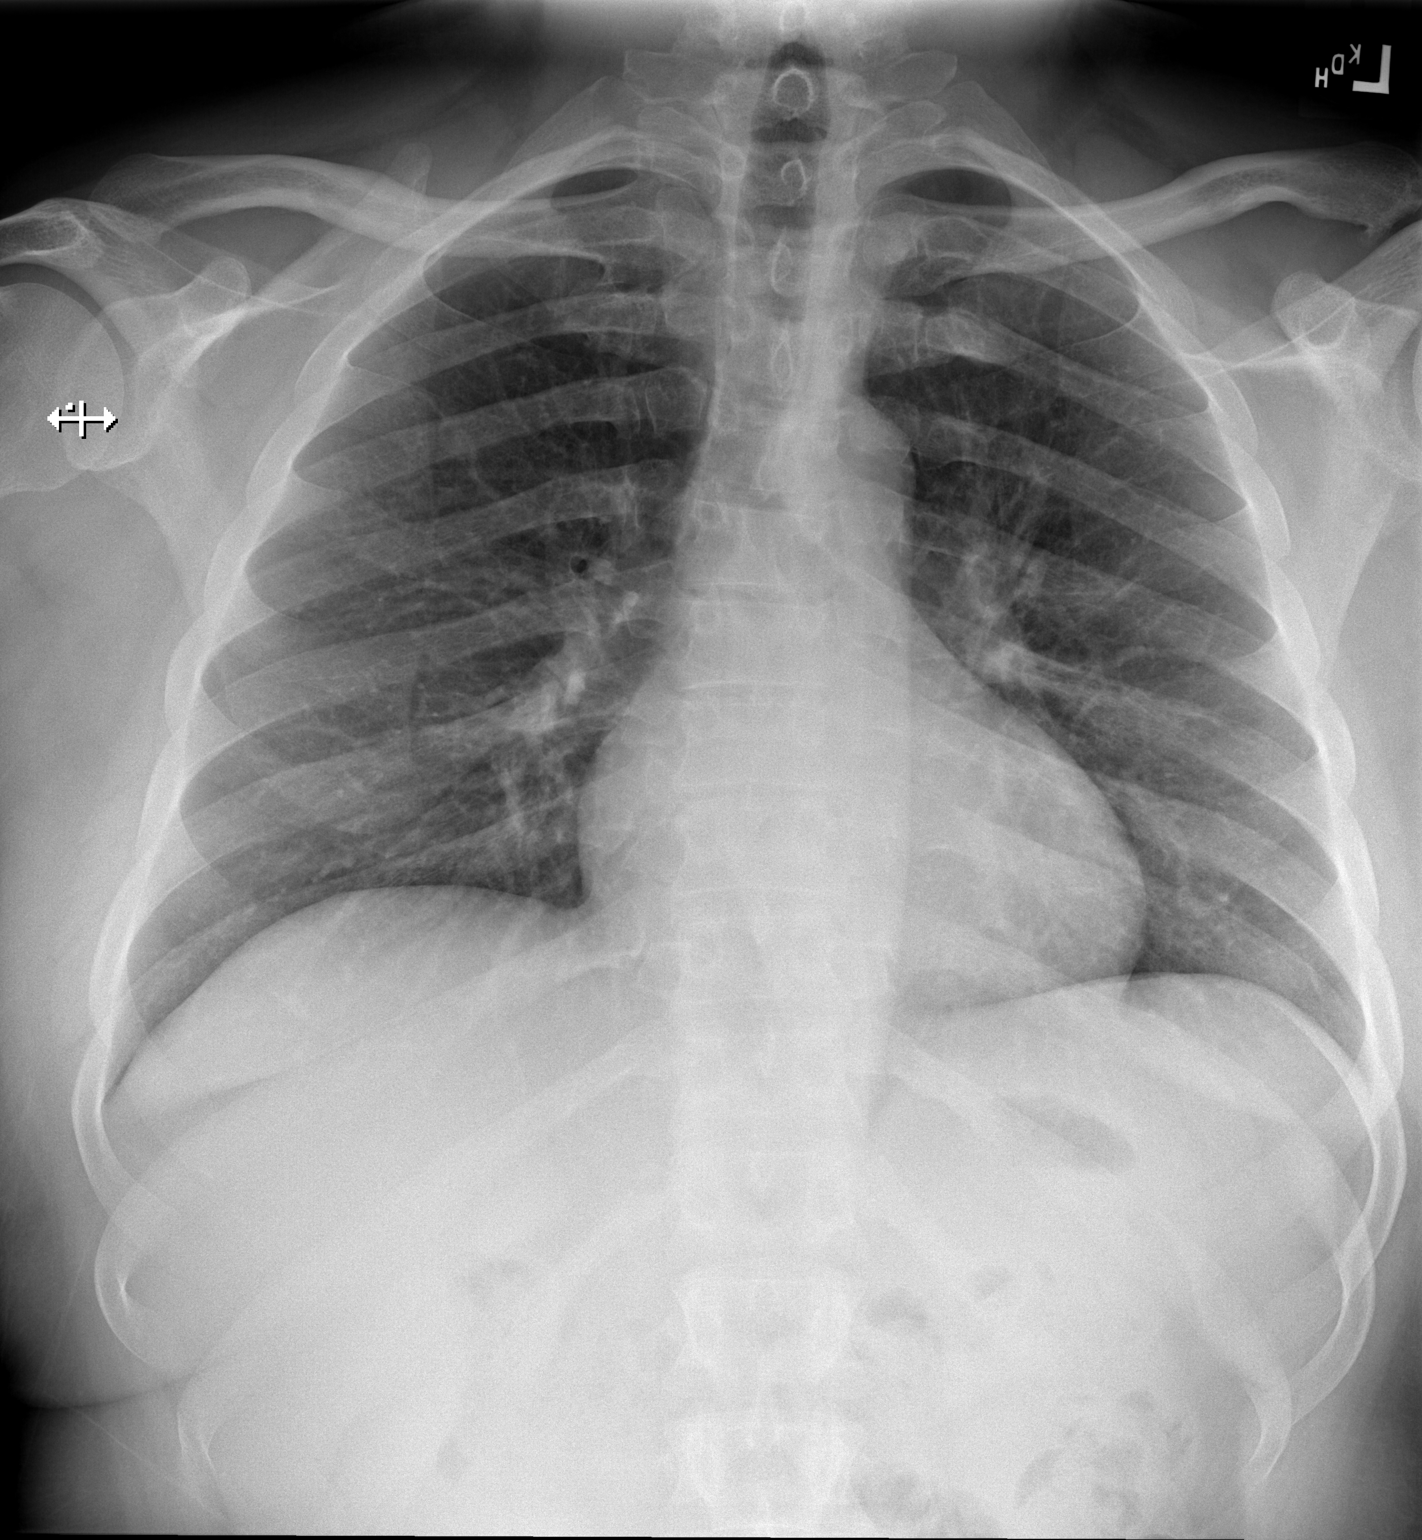
[im 2/2]
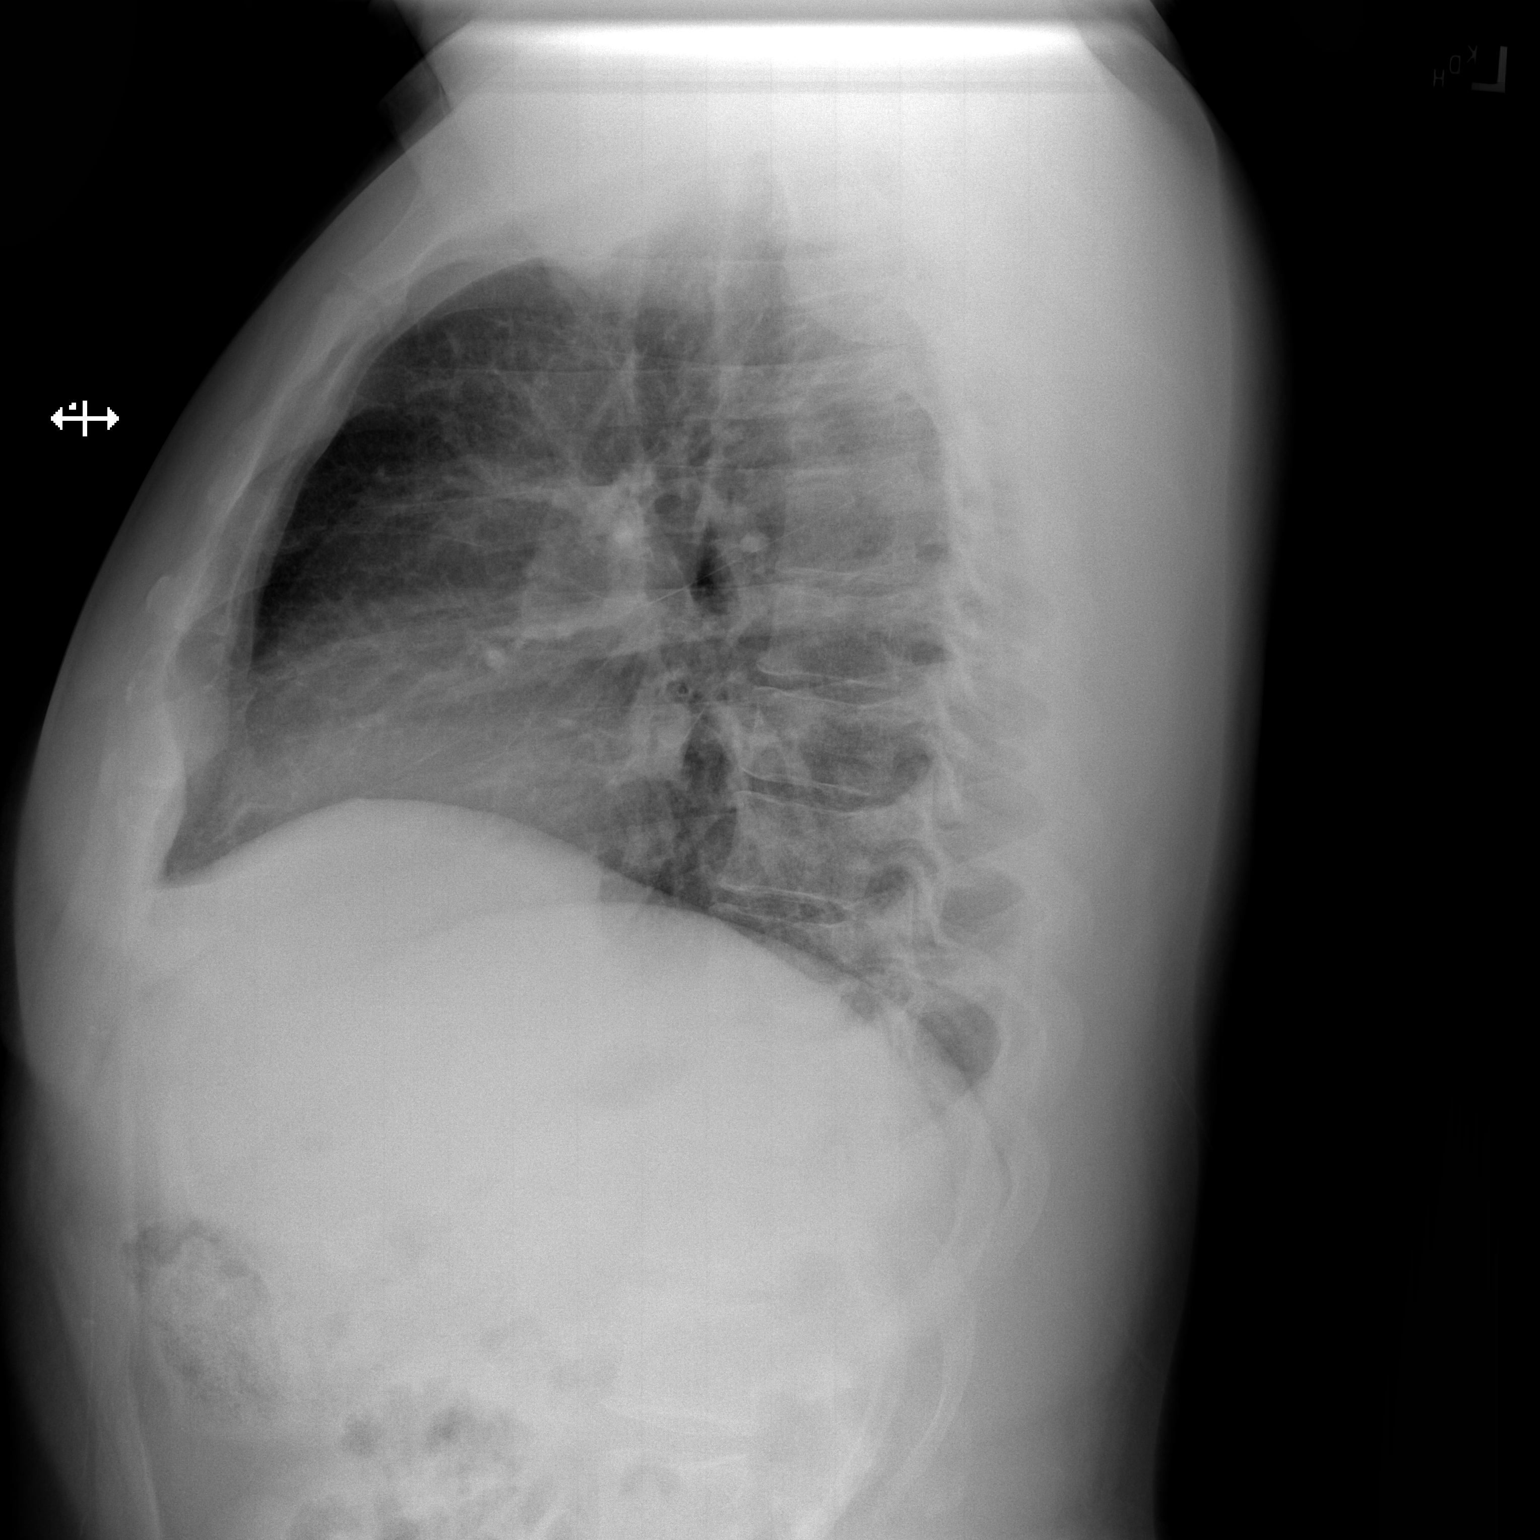

[2 of 2 positions shown; findings below may reference images not displayed]

FINDINGS: The heart size and mediastinal contours are within normal limits.
Both lungs are clear. The visualized skeletal structures are
unremarkable.
IMPRESSION: No active cardiopulmonary disease.
